# Patient Record
Sex: Female | Born: 1935 | Race: White | Hispanic: No | Smoking: Never smoker
Health system: Southern US, Community
[De-identification: ages and names within clinical notes are randomized; demographics above are authoritative.]

## PROBLEM LIST (undated history)

## (undated) DIAGNOSIS — E785 Hyperlipidemia, unspecified: Secondary | ICD-10-CM

## (undated) DIAGNOSIS — I10 Essential (primary) hypertension: Secondary | ICD-10-CM

## (undated) DIAGNOSIS — K219 Gastro-esophageal reflux disease without esophagitis: Secondary | ICD-10-CM

## (undated) DIAGNOSIS — G20 Parkinson's disease: Secondary | ICD-10-CM

## (undated) DIAGNOSIS — R251 Tremor, unspecified: Secondary | ICD-10-CM

## (undated) HISTORY — PX: APPENDECTOMY: SHX54

## (undated) HISTORY — DX: Essential (primary) hypertension: I10

## (undated) HISTORY — DX: Gastro-esophageal reflux disease without esophagitis: K21.9

## (undated) HISTORY — PX: TONSILLECTOMY: SUR1361

## (undated) HISTORY — DX: Parkinson's disease: G20

## (undated) HISTORY — DX: Hyperlipidemia, unspecified: E78.5

---

## 1998-09-24 ENCOUNTER — Emergency Department (HOSPITAL_COMMUNITY): Admission: EM | Admit: 1998-09-24 | Discharge: 1998-09-24 | Payer: Self-pay | Admitting: Emergency Medicine

## 1998-09-25 ENCOUNTER — Encounter: Payer: Self-pay | Admitting: Emergency Medicine

## 2001-06-17 ENCOUNTER — Encounter: Payer: Self-pay | Admitting: Family Medicine

## 2001-06-17 ENCOUNTER — Encounter: Admission: RE | Admit: 2001-06-17 | Discharge: 2001-06-17 | Payer: Self-pay | Admitting: Family Medicine

## 2001-07-27 ENCOUNTER — Other Ambulatory Visit: Admission: RE | Admit: 2001-07-27 | Discharge: 2001-07-27 | Payer: Self-pay | Admitting: Family Medicine

## 2002-06-18 ENCOUNTER — Encounter: Payer: Self-pay | Admitting: Family Medicine

## 2002-06-18 ENCOUNTER — Encounter: Admission: RE | Admit: 2002-06-18 | Discharge: 2002-06-18 | Payer: Self-pay | Admitting: Family Medicine

## 2003-02-03 ENCOUNTER — Encounter: Admission: RE | Admit: 2003-02-03 | Discharge: 2003-02-03 | Payer: Self-pay | Admitting: Family Medicine

## 2003-02-03 ENCOUNTER — Encounter: Payer: Self-pay | Admitting: Family Medicine

## 2003-03-08 ENCOUNTER — Ambulatory Visit (HOSPITAL_COMMUNITY): Admission: RE | Admit: 2003-03-08 | Discharge: 2003-03-09 | Payer: Self-pay | Admitting: *Deleted

## 2003-03-08 ENCOUNTER — Encounter (INDEPENDENT_AMBULATORY_CARE_PROVIDER_SITE_OTHER): Payer: Self-pay | Admitting: *Deleted

## 2003-07-07 ENCOUNTER — Encounter: Admission: RE | Admit: 2003-07-07 | Discharge: 2003-07-07 | Payer: Self-pay | Admitting: Family Medicine

## 2004-07-06 ENCOUNTER — Encounter: Admission: RE | Admit: 2004-07-06 | Discharge: 2004-07-06 | Payer: Self-pay | Admitting: Family Medicine

## 2005-05-29 HISTORY — PX: CARDIOVASCULAR STRESS TEST: SHX262

## 2005-07-08 ENCOUNTER — Encounter: Admission: RE | Admit: 2005-07-08 | Discharge: 2005-07-08 | Payer: Self-pay | Admitting: Family Medicine

## 2006-07-09 ENCOUNTER — Encounter: Admission: RE | Admit: 2006-07-09 | Discharge: 2006-07-09 | Payer: Self-pay | Admitting: Family Medicine

## 2007-07-13 ENCOUNTER — Encounter: Admission: RE | Admit: 2007-07-13 | Discharge: 2007-07-13 | Payer: Self-pay | Admitting: Family Medicine

## 2008-07-13 ENCOUNTER — Encounter: Admission: RE | Admit: 2008-07-13 | Discharge: 2008-07-13 | Payer: Self-pay | Admitting: Family Medicine

## 2009-07-18 ENCOUNTER — Encounter: Admission: RE | Admit: 2009-07-18 | Discharge: 2009-07-18 | Payer: Self-pay | Admitting: Family Medicine

## 2010-07-19 ENCOUNTER — Encounter
Admission: RE | Admit: 2010-07-19 | Discharge: 2010-07-19 | Payer: Self-pay | Source: Home / Self Care | Attending: Family Medicine | Admitting: Family Medicine

## 2010-07-26 ENCOUNTER — Ambulatory Visit: Payer: Self-pay | Admitting: Cardiovascular Disease

## 2010-11-13 ENCOUNTER — Other Ambulatory Visit: Payer: Self-pay | Admitting: Cardiovascular Disease

## 2010-11-13 DIAGNOSIS — E785 Hyperlipidemia, unspecified: Secondary | ICD-10-CM

## 2010-11-13 NOTE — Telephone Encounter (Signed)
Fax received from pharmacy. Refill completed. Jodette Dyshawn Cangelosi RN  

## 2010-12-07 NOTE — Op Note (Signed)
   NAME:  Shawna Doyle, Shawna Doyle                   ACCOUNT NO.:  [id number]   MEDICAL RECORD NO.:  [id number]                   PATIENT TYPE:  OIB   LOCATION:  NA                                   FACILITY:  [facility]   PHYSICIAN:  Vikki Ports, M.D.         DATE OF BIRTH:  1929/01/23   DATE OF PROCEDURE:  03/08/2003  DATE OF DISCHARGE:                                 OPERATIVE REPORT   PREOPERATIVE DIAGNOSIS:  Symptomatic cholelithiasis.   POSTOPERATIVE DIAGNOSIS:  Symptomatic cholelithiasis.   PROCEDURE:  Laparoscopic cholecystectomy.   SURGEON:  Vikki Ports, M.D.   ASSISTANT:  Sharlet Salina T. Hoxworth, M.D.   ANESTHESIA:  General.   DESCRIPTION OF PROCEDURE:  The patient was taken to the operating room and  placed in the supine position.  After adequate general anesthesia was  induced using endotracheal tube, the abdomen was prepped and draped in the  normal sterile fashion.  Using a transverse infraumbilical incision, I  dissected down to the fascia.  The fascia was opened vertically.  An 0  Vicryl pursestring suture was placed around the fascial defect.  Hasson  trocar was placed in the abdomen and pneumoperitoneum was obtained.  Under  direct visualization a 10 mm port was placed through the subxiphoid region,  two 5 mm ports were placed in the right abdomen.  The gallbladder was  identified and retracted cephalad.  Numerous adhesions to the inferior wall  of the gallbladder were taken down.  The infundibulum was retracted  laterally and the cystic duct was easily identified.  Its junction with the  common duct and gallbladder were verified, and it was triply clipped and  divided.  There was a very tortuous right hepatic artery giving rise to a  cystic duct branch.  This was in a rather abnormal location, but the cystic  artery was completely identified, dissected, triply clipped, and divided.  The gallbladder was on a very thin mesentery, was dissected off the  liver  bed, and removed through the umbilical port.  The infraumbilical fascial  defect was closed with the 0 Vicryl pursestring suture.  Skin incisions were  closed with subcuticular 4-0 Monocryl and Steri-Strips.  Sterile dressings  were applied.  The patient tolerated the procedure well and went to PACU in  good condition.                                               Vikki Ports, M.D.    KRH/MEDQ  D:  03/08/2003  T:  03/08/2003  Job:  [id number]

## 2011-01-29 ENCOUNTER — Other Ambulatory Visit: Payer: Self-pay | Admitting: *Deleted

## 2011-01-29 DIAGNOSIS — E785 Hyperlipidemia, unspecified: Secondary | ICD-10-CM

## 2011-02-05 ENCOUNTER — Encounter: Payer: Self-pay | Admitting: Cardiovascular Disease

## 2011-02-11 ENCOUNTER — Other Ambulatory Visit (INDEPENDENT_AMBULATORY_CARE_PROVIDER_SITE_OTHER): Payer: Medicare Other | Admitting: *Deleted

## 2011-02-11 ENCOUNTER — Ambulatory Visit (INDEPENDENT_AMBULATORY_CARE_PROVIDER_SITE_OTHER): Payer: Medicare Other | Admitting: Cardiovascular Disease

## 2011-02-11 ENCOUNTER — Encounter: Payer: Self-pay | Admitting: Cardiovascular Disease

## 2011-02-11 VITALS — BP 150/80 | HR 84 | Ht 61.0 in | Wt 142.4 lb

## 2011-02-11 DIAGNOSIS — I10 Essential (primary) hypertension: Secondary | ICD-10-CM

## 2011-02-11 DIAGNOSIS — E785 Hyperlipidemia, unspecified: Secondary | ICD-10-CM

## 2011-02-11 MED ORDER — ATORVASTATIN CALCIUM 20 MG PO TABS: 20.0000 mg | ORAL_TABLET | Freq: Every day | ORAL | Status: DC

## 2011-02-11 NOTE — Progress Notes (Signed)
Shawna Doyle Date of Birth  01/05/1929 [facility] / [facility] [address]     [address] [place], [place]  [address] [phone]  Fax  [phone]  History of Present Illness:  Shawna Doyle over it is an elderly female with a history of hypertension and hyperlipidemia. She has a history of chest pains in the past. She had a negative stress test in 2006.  She recently got back from a trip to [place] city. She is able to do all of her normal activities without any significant problems.  She takes her blood pressure on a regular basis. Her readings are typically 130 range. She denies any chest pain or shortness of breath.   Current Outpatient Prescriptions on File Prior to Visit  Medication Sig Dispense Refill  . aspirin 325 MG tablet Take 81 mg by mouth daily.       . Multiple Vitamin (MULTIVITAMIN) tablet Take 1 tablet by mouth daily.           Allergies  Allergen Reactions  . Flagyl (Metronidazole Hcl)   . Sulfur     Past Medical History  Diagnosis Date  . Hypertension   . Hyperlipidemia   . Chest pain   . GERD (gastroesophageal reflux disease)     Past Surgical History  Procedure Date  . Appendectomy   . Tonsillectomy   . Cardiovascular stress test 05/29/2005    EF 80%    History  Smoking status  . Never Smoker   Smokeless tobacco  . Not on file    History  Alcohol Use No    Family History  Problem Relation Age of Onset  . Heart attack Mother   . Heart attack Father   . Heart attack Brother     Reviw of Systems:  Reviewed in the HPI.  All other systems are negative.  Physical Exam: BP 150/80  Pulse 84  Ht 5\' 1"  (1.549 m)  Wt 142 lb 6.4 oz (64.592 kg)  BMI 26.91 kg/m2 The patient is alert and oriented x 3.  The mood and affect are normal.   Skin: warm and dry.  Color is normal.    HEENT:   the sclera are nonicteric.  The mucous membranes are moist.  The carotids are 2+ without bruits.  There is no  thyromegaly.  There is no JVD.    Lungs: clear.  The chest wall is non tender.    Heart: regular rate with a normal S1 and S2.  There are no murmurs, gallops, or rubs. The PMI is not displaced.     Abdomen: good bowel sounds.  There is no guarding or rebound.  There is no hepatosplenomegaly or tenderness.  There are no masses.   Extremities:  no clubbing, cyanosis, or edema.  The legs are without rashes.  The distal pulses are intact.   Neuro:  Cranial nerves II - XII are intact.  Motor and sensory functions are intact.    The gait is normal.  ECG:  Assessment / Plan:

## 2011-02-11 NOTE — Assessment & Plan Note (Signed)
Her blood pressure is a little bit elevated today but her typical blood pressure readings are very well controlled. We will continue with her same medications. I've asked her to exercise on a regular basis and to watch her salt intake.

## 2011-03-08 ENCOUNTER — Other Ambulatory Visit: Payer: Self-pay | Admitting: Family Medicine

## 2011-03-08 DIAGNOSIS — Z1231 Encounter for screening mammogram for malignant neoplasm of breast: Secondary | ICD-10-CM

## 2011-07-22 ENCOUNTER — Ambulatory Visit: Payer: Medicare Other

## 2011-07-24 DIAGNOSIS — M25569 Pain in unspecified knee: Secondary | ICD-10-CM | POA: Diagnosis not present

## 2011-07-25 ENCOUNTER — Ambulatory Visit
Admission: RE | Admit: 2011-07-25 | Discharge: 2011-07-25 | Disposition: A | Discharge status: Home / Self Care | Payer: Medicare Other | Source: Ambulatory Visit | Attending: Family Medicine | Admitting: Family Medicine

## 2011-07-25 DIAGNOSIS — Z1231 Encounter for screening mammogram for malignant neoplasm of breast: Secondary | ICD-10-CM | POA: Diagnosis not present

## 2011-07-31 ENCOUNTER — Encounter: Payer: Self-pay | Admitting: Cardiovascular Disease

## 2011-07-31 ENCOUNTER — Ambulatory Visit (INDEPENDENT_AMBULATORY_CARE_PROVIDER_SITE_OTHER): Payer: Medicare Other | Admitting: Cardiovascular Disease

## 2011-07-31 VITALS — BP 150/78 | HR 89 | Ht 65.0 in | Wt 143.4 lb

## 2011-07-31 DIAGNOSIS — I10 Essential (primary) hypertension: Secondary | ICD-10-CM

## 2011-07-31 NOTE — Assessment & Plan Note (Signed)
Her BP is well controlled.  Continue with her current meds.  I'll see her again in 6 months.

## 2011-07-31 NOTE — Patient Instructions (Signed)
Your physician wants you to follow-up in: 6 months You will receive a reminder letter in the mail two months in advance. If you don't receive a letter, please call our office to schedule the follow-up appointment.  Your physician recommends that you return for a FASTING lipid profile: 6 months   BRING COPY OF FASTING LABS DRAWN AT YOUR PRIMARY DR OFFICE THAT WILL BE DONE IN February/ TO OUR OFFICE PLEASE

## 2011-07-31 NOTE — Progress Notes (Signed)
    Shawna Doyle Date of Birth  1929/07/02 [facility]     [place]  [address]    [address]   [address] [place], [place]  [address]    [place], [place]  [address] [phone]  Fax  [phone]  [phone]  Fax [phone]   History of Present Illness:  Shawna Doyle is an 78 yo with a hx of hypertension and hyperlipidemia.  She has done well since I last saw her.  She denies any chest pain or dyspnea.   Current Outpatient Prescriptions on File Prior to Visit  Medication Sig Dispense Refill  . alendronate (FOSAMAX) 70 MG tablet Take 70 mg by mouth every 7 (seven) days. Take with a full glass of water on an empty stomach.       Marland Kitchen aspirin 325 MG tablet Take 81 mg by mouth daily.       Marland Kitchen atorvastatin (LIPITOR) 20 MG tablet Take 1 tablet (20 mg total) by mouth daily.  30 tablet  11  . Calcium Carb-Cholecalciferol (CALCIUM 600+D3 PO) Take by mouth 2 (two) times daily before a meal.        . Cholecalciferol (VITAMIN D PO) Take 1,000 mg by mouth daily.        Marland Kitchen losartan-hydrochlorothiazide (HYZAAR) 100-12.5 MG per tablet Take 1 tablet by mouth daily.        . Multiple Vitamin (MULTIVITAMIN) tablet Take 1 tablet by mouth daily.          Allergies  Allergen Reactions  . Flagyl (Metronidazole Hcl)   . Sulfur     Past Medical History  Diagnosis Date  . Hypertension   . Hyperlipidemia   . Chest pain   . GERD (gastroesophageal reflux disease)     Past Surgical History  Procedure Date  . Appendectomy   . Tonsillectomy   . Cardiovascular stress test 05/29/2005    EF 80%    History  Smoking status  . Never Smoker   Smokeless tobacco  . Not on file    History  Alcohol Use No    Family History  Problem Relation Age of Onset  . Heart attack Mother   . Heart attack Father   . Heart attack Brother     Reviw of Systems:  Reviewed in the HPI.  All other systems are negative.  Physical Exam: BP 150/78  Pulse 89  Ht 5\' 5"  (1.651 m)  Wt  143 lb 6.4 oz (65.046 kg)  BMI 23.86 kg/m2 The patient is alert and oriented x 3.  The mood and affect are normal.   Skin: warm and dry.  Color is normal.    HEENT:   Neck is supple, no JVD, normal carotids  Lungs: clear   Heart: RR, S1 , S2    Abdomen: + BS, nontender., no HSM  Extremities:  No c/c/e  Neuro:  Nonfocal    ECG: NSR with a 1st degree block, no ST /T abn  Assessment / Plan:

## 2011-08-21 DIAGNOSIS — M25569 Pain in unspecified knee: Secondary | ICD-10-CM | POA: Diagnosis not present

## 2011-08-29 DIAGNOSIS — E7800 Pure hypercholesterolemia, unspecified: Secondary | ICD-10-CM | POA: Diagnosis not present

## 2011-09-13 DIAGNOSIS — M25569 Pain in unspecified knee: Secondary | ICD-10-CM | POA: Diagnosis not present

## 2011-09-25 DIAGNOSIS — H4011X0 Primary open-angle glaucoma, stage unspecified: Secondary | ICD-10-CM | POA: Diagnosis not present

## 2011-09-25 DIAGNOSIS — H409 Unspecified glaucoma: Secondary | ICD-10-CM | POA: Diagnosis not present

## 2011-12-17 DIAGNOSIS — H4011X0 Primary open-angle glaucoma, stage unspecified: Secondary | ICD-10-CM | POA: Diagnosis not present

## 2011-12-17 DIAGNOSIS — H409 Unspecified glaucoma: Secondary | ICD-10-CM | POA: Diagnosis not present

## 2011-12-26 DIAGNOSIS — H6120 Impacted cerumen, unspecified ear: Secondary | ICD-10-CM | POA: Diagnosis not present

## 2011-12-26 DIAGNOSIS — L570 Actinic keratosis: Secondary | ICD-10-CM | POA: Diagnosis not present

## 2012-02-06 ENCOUNTER — Other Ambulatory Visit: Payer: Self-pay | Admitting: Cardiovascular Disease

## 2012-02-06 NOTE — Telephone Encounter (Signed)
Fax Received. Refill Completed. Caragh Gasper Chowoe (R.M.A)   

## 2012-03-05 ENCOUNTER — Ambulatory Visit: Payer: Medicare Other | Admitting: Cardiovascular Disease

## 2012-03-06 DIAGNOSIS — L570 Actinic keratosis: Secondary | ICD-10-CM | POA: Diagnosis not present

## 2012-03-17 DIAGNOSIS — H40019 Open angle with borderline findings, low risk, unspecified eye: Secondary | ICD-10-CM | POA: Diagnosis not present

## 2012-04-23 ENCOUNTER — Encounter: Payer: Self-pay | Admitting: Cardiovascular Disease

## 2012-04-23 ENCOUNTER — Ambulatory Visit (INDEPENDENT_AMBULATORY_CARE_PROVIDER_SITE_OTHER): Payer: Medicare Other | Admitting: Cardiovascular Disease

## 2012-04-23 VITALS — BP 128/68 | HR 75 | Ht 65.0 in | Wt 131.4 lb

## 2012-04-23 DIAGNOSIS — I10 Essential (primary) hypertension: Secondary | ICD-10-CM

## 2012-04-23 DIAGNOSIS — E785 Hyperlipidemia, unspecified: Secondary | ICD-10-CM | POA: Diagnosis not present

## 2012-04-23 LAB — HEPATIC FUNCTION PANEL
AST: 26 U/L (ref 0–37)
Alkaline Phosphatase: 68 U/L (ref 39–117)
Bilirubin, Direct: 0.2 mg/dL (ref 0.0–0.3)
Total Bilirubin: 0.8 mg/dL (ref 0.3–1.2)

## 2012-04-23 LAB — LIPID PANEL: Total CHOL/HDL Ratio: 2

## 2012-04-23 LAB — BASIC METABOLIC PANEL
CO2: 28 mEq/L (ref 19–32)
Calcium: 9.6 mg/dL (ref 8.4–10.5)
Creatinine, Ser: 1 mg/dL (ref 0.4–1.2)
Glucose, Bld: 99 mg/dL (ref 70–99)

## 2012-04-23 NOTE — Progress Notes (Signed)
    Sharol Given Date of Birth  Dec 01, 1928 [facility]     [place]  [address]    [address]   [address] [place], [place]  [address]    [place], [place]  [address] [phone]  Fax  [phone]  [phone]  Fax [phone]  Problems 1. Hypertension 2. Hyperlipidemia   History of Present Illness:  Gabriela is an 78 yo with a hx of hypertension and hyperlipidemia.  She has done well since I last saw her.  She denies any chest pain or dyspnea.   She has been exercising regularly - she goes to the gym.  She has been having difficulty with her right knee and his been getting injections.  She deines any chest pain or dyspnea.  Current Outpatient Prescriptions on File Prior to Visit  Medication Sig Dispense Refill  . alendronate (FOSAMAX) 70 MG tablet Take 70 mg by mouth every 7 (seven) days. Take with a full glass of water on an empty stomach.       Marland Kitchen aspirin 325 MG tablet Take 81 mg by mouth daily.       Marland Kitchen atorvastatin (LIPITOR) 20 MG tablet TAKE ONE TABLET BY MOUTH EVERY DAY  30 tablet  5  . Calcium Carb-Cholecalciferol (CALCIUM 600+D3 PO) Take by mouth 2 (two) times daily before a meal.        . Cholecalciferol (VITAMIN D PO) Take 1,000 mg by mouth daily.        Marland Kitchen losartan-hydrochlorothiazide (HYZAAR) 100-12.5 MG per tablet Take 1 tablet by mouth daily.        . Multiple Vitamin (MULTIVITAMIN) tablet Take 1 tablet by mouth daily.          Allergies  Allergen Reactions  . Flagyl (Metronidazole Hcl)   . Sulfur     Past Medical History  Diagnosis Date  . Hypertension   . Hyperlipidemia   . Chest pain   . GERD (gastroesophageal reflux disease)     Past Surgical History  Procedure Date  . Appendectomy   . Tonsillectomy   . Cardiovascular stress test 05/29/2005    EF 80%    History  Smoking status  . Never Smoker   Smokeless tobacco  . Not on file    History  Alcohol Use No    Family History  Problem Relation Age of Onset    . Heart attack Mother   . Heart attack Father   . Heart attack Brother     Reviw of Systems:  Reviewed in the HPI.  All other systems are negative.  Physical Exam: BP 157/74  Pulse 75  Ht 5\' 5"  (1.651 m)  Wt 131 lb 6.4 oz (59.603 kg)  BMI 21.87 kg/m2 The patient is alert and oriented x 3.  The mood and affect are normal.   Skin: warm and dry.  Color is normal.    HEENT:   Neck is supple, no JVD, normal carotids  Lungs: clear   Heart: RR, S1 , S2    Abdomen: + BS, nontender., no HSM  Extremities:  No c/c/e  Neuro:  Nonfocal    ECG: NSR with a 1st degree block, no ST /T abn  Assessment / Plan:

## 2012-04-23 NOTE — Patient Instructions (Addendum)
Your physician wants you to follow-up in: 6 months You will receive a reminder letter in the mail two months in advance. If you don't receive a letter, please call our office to schedule the follow-up appointment.   Your physician recommends that you return for a FASTING lipid profile: today/ 6 months

## 2012-04-23 NOTE — Assessment & Plan Note (Signed)
We'll check her lipids today. We'll see back in 6 months and check a lipid profile, hepatic profile, and basic metabolic profile. She is to continue the current dose of atorvastatin.

## 2012-04-23 NOTE — Assessment & Plan Note (Signed)
Shawna Doyle is doing very well. She will continue with her same medications. I'll see her again in 6 months.

## 2012-04-29 ENCOUNTER — Encounter: Payer: Self-pay | Admitting: Cardiovascular Disease

## 2012-05-07 DIAGNOSIS — Z23 Encounter for immunization: Secondary | ICD-10-CM | POA: Diagnosis not present

## 2012-05-21 DIAGNOSIS — J069 Acute upper respiratory infection, unspecified: Secondary | ICD-10-CM | POA: Diagnosis not present

## 2012-05-26 ENCOUNTER — Other Ambulatory Visit: Payer: Self-pay | Admitting: Family Medicine

## 2012-05-26 DIAGNOSIS — Z1231 Encounter for screening mammogram for malignant neoplasm of breast: Secondary | ICD-10-CM

## 2012-07-07 DIAGNOSIS — H409 Unspecified glaucoma: Secondary | ICD-10-CM | POA: Diagnosis not present

## 2012-07-07 DIAGNOSIS — H4011X0 Primary open-angle glaucoma, stage unspecified: Secondary | ICD-10-CM | POA: Diagnosis not present

## 2012-07-27 ENCOUNTER — Ambulatory Visit
Admission: RE | Admit: 2012-07-27 | Discharge: 2012-07-27 | Disposition: A | Discharge status: Home / Self Care | Payer: Medicare Other | Source: Ambulatory Visit | Attending: Family Medicine | Admitting: Family Medicine

## 2012-07-27 DIAGNOSIS — Z1231 Encounter for screening mammogram for malignant neoplasm of breast: Secondary | ICD-10-CM

## 2012-08-11 ENCOUNTER — Other Ambulatory Visit: Payer: Self-pay | Admitting: *Deleted

## 2012-08-11 MED ORDER — ATORVASTATIN CALCIUM 20 MG PO TABS: 20.0000 mg | ORAL_TABLET | Freq: Every day | ORAL | Status: DC

## 2012-08-11 NOTE — Telephone Encounter (Signed)
Fax Received. Refill Completed. Brainard Highfill Chowoe (R.M.A)   

## 2012-08-12 ENCOUNTER — Other Ambulatory Visit: Payer: Self-pay | Admitting: *Deleted

## 2012-08-12 NOTE — Telephone Encounter (Signed)
Opened in Error.

## 2012-08-26 ENCOUNTER — Other Ambulatory Visit: Payer: Self-pay | Admitting: Family Medicine

## 2012-08-26 ENCOUNTER — Ambulatory Visit
Admission: RE | Admit: 2012-08-26 | Discharge: 2012-08-26 | Disposition: A | Discharge status: Home / Self Care | Payer: Medicare Other | Source: Ambulatory Visit | Attending: Family Medicine | Admitting: Family Medicine

## 2012-08-26 DIAGNOSIS — R52 Pain, unspecified: Secondary | ICD-10-CM

## 2012-08-26 DIAGNOSIS — M79609 Pain in unspecified limb: Secondary | ICD-10-CM | POA: Diagnosis not present

## 2012-09-05 ENCOUNTER — Other Ambulatory Visit: Payer: Self-pay

## 2012-09-10 ENCOUNTER — Encounter: Payer: Self-pay | Admitting: Cardiovascular Disease

## 2012-09-10 DIAGNOSIS — I2510 Atherosclerotic heart disease of native coronary artery without angina pectoris: Secondary | ICD-10-CM | POA: Diagnosis not present

## 2012-09-10 DIAGNOSIS — Z23 Encounter for immunization: Secondary | ICD-10-CM | POA: Diagnosis not present

## 2012-09-10 DIAGNOSIS — M899 Disorder of bone, unspecified: Secondary | ICD-10-CM | POA: Diagnosis not present

## 2012-09-10 DIAGNOSIS — E7800 Pure hypercholesterolemia, unspecified: Secondary | ICD-10-CM | POA: Diagnosis not present

## 2012-09-10 DIAGNOSIS — M949 Disorder of cartilage, unspecified: Secondary | ICD-10-CM | POA: Diagnosis not present

## 2012-09-10 DIAGNOSIS — I10 Essential (primary) hypertension: Secondary | ICD-10-CM | POA: Diagnosis not present

## 2012-09-10 DIAGNOSIS — M79609 Pain in unspecified limb: Secondary | ICD-10-CM | POA: Diagnosis not present

## 2012-09-10 DIAGNOSIS — E559 Vitamin D deficiency, unspecified: Secondary | ICD-10-CM | POA: Diagnosis not present

## 2012-09-10 DIAGNOSIS — Z0000 Encounter for general adult medical examination without abnormal findings: Secondary | ICD-10-CM | POA: Diagnosis not present

## 2012-09-22 DIAGNOSIS — E876 Hypokalemia: Secondary | ICD-10-CM | POA: Diagnosis not present

## 2012-10-05 DIAGNOSIS — M25569 Pain in unspecified knee: Secondary | ICD-10-CM | POA: Diagnosis not present

## 2012-10-21 DIAGNOSIS — M25569 Pain in unspecified knee: Secondary | ICD-10-CM | POA: Diagnosis not present

## 2012-10-27 DIAGNOSIS — H409 Unspecified glaucoma: Secondary | ICD-10-CM | POA: Diagnosis not present

## 2012-10-27 DIAGNOSIS — H4011X0 Primary open-angle glaucoma, stage unspecified: Secondary | ICD-10-CM | POA: Diagnosis not present

## 2012-12-02 DIAGNOSIS — H6120 Impacted cerumen, unspecified ear: Secondary | ICD-10-CM | POA: Diagnosis not present

## 2013-01-15 ENCOUNTER — Ambulatory Visit (INDEPENDENT_AMBULATORY_CARE_PROVIDER_SITE_OTHER): Payer: Medicare Other | Admitting: Cardiovascular Disease

## 2013-01-15 ENCOUNTER — Encounter: Payer: Self-pay | Admitting: Cardiovascular Disease

## 2013-01-15 VITALS — BP 142/76 | HR 91 | Ht 65.0 in | Wt 123.1 lb

## 2013-01-15 DIAGNOSIS — E785 Hyperlipidemia, unspecified: Secondary | ICD-10-CM

## 2013-01-15 DIAGNOSIS — I10 Essential (primary) hypertension: Secondary | ICD-10-CM

## 2013-01-15 LAB — HEPATIC FUNCTION PANEL
ALT: 16 U/L (ref 0–35)
AST: 24 U/L (ref 0–37)
Albumin: 4 g/dL (ref 3.5–5.2)
Alkaline Phosphatase: 58 U/L (ref 39–117)
Bilirubin, Direct: 0 mg/dL (ref 0.0–0.3)
Total Bilirubin: 0.6 mg/dL (ref 0.3–1.2)
Total Protein: 7.6 g/dL (ref 6.0–8.3)

## 2013-01-15 LAB — BASIC METABOLIC PANEL
BUN: 21 mg/dL (ref 6–23)
CO2: 25 mEq/L (ref 19–32)
Calcium: 9.9 mg/dL (ref 8.4–10.5)
Chloride: 103 mEq/L (ref 96–112)
Creatinine, Ser: 1 mg/dL (ref 0.4–1.2)
GFR: 59.13 mL/min — ABNORMAL LOW (ref >60.00)
Glucose, Bld: 97 mg/dL (ref 70–99)
Potassium: 3.6 mEq/L (ref 3.5–5.1)
Sodium: 138 mEq/L (ref 135–145)

## 2013-01-15 LAB — LIPID PANEL
Cholesterol: 135 mg/dL (ref 0–200)
HDL: 66.5 mg/dL (ref >39.00)
LDL Cholesterol: 54 mg/dL (ref 0–99)
Total CHOL/HDL Ratio: 2
Triglycerides: 71 mg/dL (ref 0.0–149.0)
VLDL: 14.2 mg/dL (ref 0.0–40.0)

## 2013-01-15 NOTE — Patient Instructions (Addendum)
Your physician recommends that you return for lab work in: today  Your physician wants you to follow-up in: 6 months. You will receive a reminder letter in the mail two months in advance. If you don't receive a letter, please call our office to schedule the follow-up appointment.   

## 2013-01-15 NOTE — Assessment & Plan Note (Signed)
We'll check fasting lipids today. We'll check them again when I see her again in 6 months.

## 2013-01-15 NOTE — Progress Notes (Signed)
Shawna Doyle Date of Birth  [birth year] [facility]     [place]  [address]    [address]   [address] [place], [place]  [address]    [place], [place]  [address] [phone]  Fax  [phone]  [phone]  Fax [phone]  Problems 1. Hypertension 2. Hyperlipidemia   History of Present Illness:  Shawna Doyle is an 79 yo with a hx of hypertension and hyperlipidemia.  She has done well since I last saw her.  She denies any chest pain or dyspnea.   She has been exercising regularly - she goes to the gym.  She has been having difficulty with her right knee and his been getting injections.  She deines any chest pain or dyspnea.  January 15, 2013:  Shawna Doyle is doing well.  She is able to do all of her normal activities without problems.  She did have some weakness after doing her laundry.  She felt better after eating and drinking and taking a rest.   No CP , no dyspnea.    Current Outpatient Prescriptions on File Prior to Visit  Medication Sig Dispense Refill  . alendronate (FOSAMAX) 70 MG tablet Take 70 mg by mouth every 7 (seven) days. Take with a full glass of water on an empty stomach.       Marland Kitchen atorvastatin (LIPITOR) 20 MG tablet Take 1 tablet (20 mg total) by mouth daily.  30 tablet  5  . Calcium Carb-Cholecalciferol (CALCIUM 600+D3 PO) Take by mouth 2 (two) times daily before a meal.        . Cholecalciferol (VITAMIN D PO) Take 1,000 mg by mouth daily.        Marland Kitchen losartan-hydrochlorothiazide (HYZAAR) 100-12.5 MG per tablet Take 1 tablet by mouth daily.        . Multiple Vitamin (MULTIVITAMIN) tablet Take 1 tablet by mouth daily.         No current facility-administered medications on file prior to visit.    Allergies  Allergen Reactions  . Flagyl (Metronidazole Hcl)   . Sulfur     Past Medical History  Diagnosis Date  . Hypertension   . Hyperlipidemia   . Chest pain   . GERD (gastroesophageal reflux disease)     Past Surgical History   Procedure Laterality Date  . Appendectomy    . Tonsillectomy    . Cardiovascular stress test  05/29/2005    EF 80%    History  Smoking status  . Never Smoker   Smokeless tobacco  . Not on file    History  Alcohol Use No    Family History  Problem Relation Age of Onset  . Heart attack Mother   . Heart attack Father   . Heart attack Brother     Reviw of Systems:  Reviewed in the HPI.  All other systems are negative.  Physical Exam: BP 142/76  Pulse 91  Ht 5\' 5"  (1.651 m)  Wt 123 lb 1.9 oz (55.847 kg)  BMI 20.49 kg/m2 The patient is alert and oriented x 3.  The mood and affect are normal.   Skin: warm and dry.  Color is normal.    HEENT:   Neck is supple, no JVD, normal carotids  Lungs: clear   Heart: RR, S1 , S2    Abdomen: + BS, nontender., no HSM  Extremities:  No c/c/e  Neuro:  Nonfocal    ECG: January 15, 2013:  NSR at 91,  Inc. RBBB.    Assessment / Plan:

## 2013-01-15 NOTE — Assessment & Plan Note (Signed)
Shawna Doyle is doing very well. She's not having episodes of chest pain or shortness of breath. She's able to do all the chores without any significant problems.  Check labs today. See her again in 6 months for office visit and repeat labs.

## 2013-01-18 DIAGNOSIS — R259 Unspecified abnormal involuntary movements: Secondary | ICD-10-CM | POA: Diagnosis not present

## 2013-01-27 ENCOUNTER — Other Ambulatory Visit: Payer: Self-pay | Admitting: *Deleted

## 2013-01-27 MED ORDER — ATORVASTATIN CALCIUM 20 MG PO TABS: 20.0000 mg | ORAL_TABLET | Freq: Every day | ORAL | Status: DC

## 2013-01-27 NOTE — Telephone Encounter (Signed)
Fax Received. Refill Completed. Shawna Doyle (R.M.A)   

## 2013-02-11 ENCOUNTER — Encounter: Payer: Self-pay | Admitting: Neurology

## 2013-02-11 ENCOUNTER — Ambulatory Visit (INDEPENDENT_AMBULATORY_CARE_PROVIDER_SITE_OTHER): Payer: Medicare Other | Admitting: Neurology

## 2013-02-11 ENCOUNTER — Other Ambulatory Visit: Payer: Self-pay | Admitting: Neurology

## 2013-02-11 VITALS — BP 149/73 | HR 76 | Temp 98.3°F | Ht 62.5 in | Wt 127.0 lb

## 2013-02-11 DIAGNOSIS — Z8632 Personal history of gestational diabetes: Secondary | ICD-10-CM | POA: Diagnosis not present

## 2013-02-11 DIAGNOSIS — G20 Parkinson's disease: Secondary | ICD-10-CM | POA: Diagnosis not present

## 2013-02-11 DIAGNOSIS — G20A1 Parkinson's disease without dyskinesia, without mention of fluctuations: Secondary | ICD-10-CM | POA: Diagnosis not present

## 2013-02-11 DIAGNOSIS — R768 Other specified abnormal immunological findings in serum: Secondary | ICD-10-CM

## 2013-02-11 DIAGNOSIS — Z79899 Other long term (current) drug therapy: Secondary | ICD-10-CM | POA: Diagnosis not present

## 2013-02-11 NOTE — Progress Notes (Addendum)
Subjective:    Patient ID: Shawna Doyle is a 79 y.o. female.  HPI  Huston Foley, MD, PhD [facility] [address], [address] [address] [place], [place] [address]  Dear Dr. Valentina Lucks,   I saw your patient, Shawna Doyle, upon your kind request in my neurologic clinic today for initial consultation of her new onset tremor, and changes in her handwriting, concern for Parkinson's disease. The patient is unaccompanied today. As you know, Shawna Doyle is a very pleasant 79 year old right-handed woman with an underlying medical history of hyperlipidemia, hypertension, colonic polyps, heart disease, diverticulitis, osteopenia and vitamin D deficiency who has noted a new onset right arm tremor for about 2 months. She feels her handwriting is affected by that but has not noticed much in the way of weakness. She denies recurrent headaches changes with her speech or swallowing, changes in her gait or any other focal neurologic problems. She has had some loss of appetite and has lost some weight. There is no family history of Parkinson's disease or tremors and there is no personal history of neuroleptic medication use, no pesticide or toxin exposures. She's currently on multivitamin, calcium, Lipitor, baby aspirin, vitamin D, alendronate, losartan hydrochlorothiazide. I reviewed recent blood work from March which included a CMP, lipid panel, vitamin D, all of which were fairly unremarkable. Her family has noted the tremor. She fell about 2 months ago, while walking the dog, and fell on the road, no Fx, no head injury, no LOC. She has not noted any memory loss, mood d/o, hallucinations, or recurrent HAs. She is mildly bothered by the tremor, in that it is more of a nuisance. She has not fallen recently other than that.    Her Past Medical History Is Significant For: Past Medical History  Diagnosis Date  . Hypertension   . Hyperlipidemia   . Chest pain   . GERD  (gastroesophageal reflux disease)     Her Past Surgical History Is Significant For: Past Surgical History  Procedure Laterality Date  . Appendectomy    . Tonsillectomy    . Cardiovascular stress test  05/29/2005    EF 80%    Her Family History Is Significant For: Family History  Problem Relation Age of Onset  . Heart attack Mother   . Heart attack Father   . Heart attack Brother     Her Social History Is Significant For: History   Social History  . Marital Status: Widowed    Spouse Name: N/A    Number of Children: N/A  . Years of Education: N/A   Social History Main Topics  . Smoking status: Never Smoker   . Smokeless tobacco: None  . Alcohol Use: No  . Drug Use: No  . Sexually Active:    Other Topics Concern  . None   Social History Narrative  . None    Her Allergies Are:  Allergies  Allergen Reactions  . Flagyl (Metronidazole Hcl)   . Sulfur   :   Her Current Medications Are:  Outpatient Encounter Prescriptions as of 02/11/2013  Medication Sig Dispense Refill  . alendronate (FOSAMAX) 70 MG tablet Take 70 mg by mouth every 7 (seven) days. Take with a full glass of water on an empty stomach.       Marland Kitchen aspirin 81 MG tablet Take 81 mg by mouth daily.      Marland Kitchen atorvastatin (LIPITOR) 20 MG tablet Take 1 tablet (20 mg total) by mouth daily.  30 tablet  5  . Calcium Carb-Cholecalciferol (CALCIUM 600+D3 PO) Take by mouth 2 (two) times daily before a meal.        . Cholecalciferol (VITAMIN D PO) Take 1,000 mg by mouth daily.        Marland Kitchen losartan-hydrochlorothiazide (HYZAAR) 100-12.5 MG per tablet Take 1 tablet by mouth daily.        . Multiple Vitamin (MULTIVITAMIN) tablet Take 1 tablet by mouth daily.         No facility-administered encounter medications on file as of 02/11/2013.  :  Review of Systems  HENT: Positive for rhinorrhea.   Neurological: Positive for tremors.    Objective:  Neurologic Exam  Physical Exam Physical Examination:   Filed Vitals:    02/11/13 1337  BP: 149/73  Pulse: 76  Temp: 98.3 F (36.8 C)    General Examination: The patient is a very pleasant 79 y.o. female in no acute distress.  HEENT: Normocephalic, atraumatic, pupils are equal, round and reactive to light and accommodation. Funduscopic exam is normal with sharp disc margins noted. Extraocular tracking shows mild saccadic breakdown without nystagmus noted. There is limitation to upper gaze. There is no decrease in eye blink rate. Hearing is impaired mildly; she has bilateral hearing aids.  Face is symmetric with minimal facial masking and normal facial sensation. There is no lip, neck or jaw tremor. Neck is moderately rigid with intact passive ROM. There are no carotid bruits on auscultation. Oropharynx exam reveals mild mouth dryness. No significant airway crowding is noted. Mallampati is class II. Tongue protrudes centrally and palate elevates symmetrically.   There is no drooling.   Chest: is clear to auscultation without wheezing, rhonchi or crackles noted.  Heart: sounds are regular and normal without murmurs, rubs or gallops noted.   Abdomen: is soft, non-tender and non-distended with normal bowel sounds appreciated on auscultation.  Extremities: There is no pitting edema in the distal lower extremities bilaterally. Pedal pulses are intact. There are no varicose veins.  Skin: is warm and dry with no trophic changes noted. Age-related changes are noted on the skin.   Musculoskeletal: exam reveals no obvious joint deformities other than arthritic changes to her hands, no tenderness, joint swelling or erythema.  Neurologically:  Mental status: The patient is awake and alert, paying good  attention. She is able to completely provide the history. She is oriented to: person, place, time/date, situation, day of week, month of year and year. Her memory, attention, language and knowledge are intact. There is no aphasia, agnosia, apraxia or anomia. There is a no  bradyphrenia. Speech is mildly hypophonic with no dysarthria noted. Mood is congruent and affect is normal.    Cranial nerves are as described above under HEENT exam. In addition, shoulder shrug is normal with equal shoulder height noted.  Motor exam: Normal bulk, and strength for age is noted. There are no dyskinesias noted. Tone is minimally rigid on the RUE with presence of cogwheeling in the right upper extremity. There is overall minimal bradykinesia. There is no drift or rebound.  There is a very mild resting tremor in the right upper extremity and a no other resting tremor. The is intermittent. There is a minimal postural component, no action tremor. Romberg is negative.  Reflexes are 2+ in the upper extremities and 1+ in the lower extremities.   Fine motor skills exam: Finger taps are moderately impaired on the right and mildly impaired on the left. Hand movements are mildly impaired on the right and  not impaired on the left. RAP (rapid alternating patting) is mildly impaired on the right and not impaired on the left. Foot taps are moderately impaired on the right and mildly impaired on the left. Foot agility (in the form of heel stomping) is moderately impaired on the right and mildly impaired on the left.    Cerebellar testing shows no dysmetria or intention tremor on finger to nose testing. Heel to shin is unremarkable bilaterally. There is no truncal or gait ataxia.   Sensory exam is intact to light touch, pinprick, vibration, temperature sense and proprioception in the upper and lower extremities.   Gait, station and balance: She stands up from the seated position with very mild difficulty and does not need to push up with Her hands. She needs no assistance. No veering to one side is noted. She is not noted to lean to the side. Posture is mildly stooped, possibly age-appropriate. Stance is narrow-based. She walks with decrease in stride length and pace and decreased arm swing on the right.  She turns in 3 steps. Tandem walk is not possible. Balance is mildly impaired.   Assessment and Plan:   Assessment and Plan:  In summary, Shawna Doyle is a very pleasant 79 y.o.-year old female with a history and exam consistent with mild parkinsonism, possible R sided Parkinson's disease. I had a long chat with the patient about Her symptoms, my findings and the diagnosis of parkinsonism/Parksinson's disease, its prognosis and treatment options. We talked about medical treatments and non-pharmacological approaches. We talked about maintaining a healthy lifestyle in general. I encouraged the patient to eat healthy, exercise daily and keep well hydrated, to keep a scheduled bedtime and wake time routine, to not skip any meals and eat healthy snacks in between meals and to have protein with every meal. In particular, I stressed the importance of regular exercise, within of course the patient's own mobility limitations. Since Sx are mild and intermittent I would adopt a watchful waiting position, and I am reluctant to put her on Levodopa for fear of SE and since she lives alone. I do want to proceed with a brain MRI and suggested w/u with some additional blood work. I answered all her questions today and the patient was in agreement with the above outlined plan. I would like to see the patient back in 3 months for re-assessment, sooner if the need arises and encouraged her to call with any interim questions, concerns, problems or updates.   Thank you very much for allowing me to participate in the care of this nice patient. If I can be of any further assistance to you please do not hesitate to call me at [phone].  Sincerely,   Huston Foley, MD, PhD

## 2013-02-11 NOTE — Patient Instructions (Addendum)
I think overall you are doing fairly well but I do want to suggest a few things today:  Remember to drink plenty of fluid, eat healthy meals and do not skip any meals. Try to eat protein with a every meal and eat a healthy snack such as fruit or nuts in between meals. Try to keep a regular sleep-wake schedule and try to exercise daily, particularly in the form of walking, 20-30 minutes a day, if you can.   Engage in social activities in your community and with your family and try to keep up with current events by reading the newspaper or watching the news.   As far as your medications are concerned, I would like to suggest    As far as diagnostic testing: MRI brain and blood work.  I would like to see you back in 3 months, sooner if we need to. Please call us with any interim questions, concerns, problems, updates or refill requests.  Please also call us for any test results so we can go over those with you on the phone. Brett Canales is my clinical assistant and will answer any of your questions and relay your messages to me and also relay most of my messages to you.  Our phone number is [phone]. We also have an after hours call service for urgent matters and there is a physician on-call for urgent questions. For any emergencies you know to call 911 or go to the nearest emergency room.

## 2013-02-13 LAB — HGB A1C W/O EAG: Hgb A1c MFr Bld: 6.2 % — ABNORMAL HIGH (ref 4.8–5.6)

## 2013-02-13 LAB — RPR: RPR: NONREACTIVE

## 2013-02-13 LAB — CERULOPLASMIN: Ceruloplasmin: 22.4 mg/dL (ref 16.0–45.0)

## 2013-02-16 NOTE — Progress Notes (Signed)
Quick Note:  Please call patient and advise her that the blood work so far looks fine but the diabetes marker was slightly elevated at 6.2 indicating increased risk for diabetes and rheumatoid factor which is an inflammatory marker and most commonly positive in rheumatoid arthritis was also elevated. Which should be willing to see a rheumatologist? I just want to make sure we're not missing anything. If she is okay with seeing a rheumatologist in consultation I will put in a referral. Please let me know. Huston Foley, MD, PhD [facility] ([facility]  ______

## 2013-02-17 NOTE — Progress Notes (Signed)
Quick Note:  Spoke with patient and relayed results of blood work. Shawna Doyle would like to be referred to a rheumatologist. The patient was also reminded of any future appointments. Patient understood and had no questions.   ______

## 2013-02-17 NOTE — Addendum Note (Signed)
Addended by: Huston Foley on: 02/17/2013 12:28 PM   Modules accepted: Orders

## 2013-02-17 NOTE — Progress Notes (Signed)
Please help initiate referral to rheum, thx

## 2013-02-23 DIAGNOSIS — H409 Unspecified glaucoma: Secondary | ICD-10-CM | POA: Diagnosis not present

## 2013-02-23 DIAGNOSIS — H4011X0 Primary open-angle glaucoma, stage unspecified: Secondary | ICD-10-CM | POA: Diagnosis not present

## 2013-02-24 ENCOUNTER — Other Ambulatory Visit: Payer: Self-pay

## 2013-03-05 DIAGNOSIS — H409 Unspecified glaucoma: Secondary | ICD-10-CM | POA: Diagnosis not present

## 2013-03-05 DIAGNOSIS — H4011X0 Primary open-angle glaucoma, stage unspecified: Secondary | ICD-10-CM | POA: Diagnosis not present

## 2013-03-13 ENCOUNTER — Ambulatory Visit
Admission: RE | Admit: 2013-03-13 | Discharge: 2013-03-13 | Disposition: A | Discharge status: Home / Self Care | Payer: Medicare Other | Source: Ambulatory Visit | Attending: Neurology | Admitting: Neurology

## 2013-03-13 DIAGNOSIS — R269 Unspecified abnormalities of gait and mobility: Secondary | ICD-10-CM

## 2013-03-13 DIAGNOSIS — G20 Parkinson's disease: Secondary | ICD-10-CM

## 2013-03-18 NOTE — Progress Notes (Signed)
Quick Note:  Please call patient regarding the recent brain MRI: The brain scan showed a normal structure of the brain, but some global volume loss which we call atrophy. There were changes in the deeper structures of the brain, which we call white matter changes or microvascular changes. These were reported as mild. These are tiny white spots, that occur with time and are seen in a variety of conditions, including with normal aging, chronic hypertension, chronic headaches, especially migraine HAs, chronic diabetes, chronic hyperlipidemia. These are not strokes and no mass or lesion or contrast enhancement was seen which is reassuring. Again, there were no acute findings, such as a stroke, or mass or blood products. No further action is required on this test at this time, other than re-enforcing the importance of good blood pressure control, good cholesterol control, good blood sugar control, and weight management. Please remind patient to keep any upcoming appointments or tests and to call us with any interim questions, concerns, problems or updates. Thanks,  Huston Foley, MD, PhD    ______

## 2013-03-19 ENCOUNTER — Telehealth: Payer: Self-pay | Admitting: Neurology

## 2013-03-19 DIAGNOSIS — M899 Disorder of bone, unspecified: Secondary | ICD-10-CM | POA: Diagnosis not present

## 2013-03-19 DIAGNOSIS — E559 Vitamin D deficiency, unspecified: Secondary | ICD-10-CM | POA: Diagnosis not present

## 2013-03-19 DIAGNOSIS — M1990 Unspecified osteoarthritis, unspecified site: Secondary | ICD-10-CM | POA: Diagnosis not present

## 2013-03-19 DIAGNOSIS — I10 Essential (primary) hypertension: Secondary | ICD-10-CM | POA: Diagnosis not present

## 2013-03-19 DIAGNOSIS — G20 Parkinson's disease: Secondary | ICD-10-CM | POA: Diagnosis not present

## 2013-03-19 DIAGNOSIS — E7800 Pure hypercholesterolemia, unspecified: Secondary | ICD-10-CM | POA: Diagnosis not present

## 2013-03-23 ENCOUNTER — Telehealth: Payer: Self-pay

## 2013-03-23 DIAGNOSIS — M2550 Pain in unspecified joint: Secondary | ICD-10-CM | POA: Diagnosis not present

## 2013-03-23 DIAGNOSIS — R894 Abnormal immunological findings in specimens from other organs, systems and tissues: Secondary | ICD-10-CM | POA: Diagnosis not present

## 2013-03-23 DIAGNOSIS — M79609 Pain in unspecified limb: Secondary | ICD-10-CM | POA: Diagnosis not present

## 2013-03-23 NOTE — Telephone Encounter (Signed)
Message copied by [facility] on Tue Mar 23, 2013  9:19 AM ------      Message from: Huston Foley      Created: Thu Mar 18, 2013  5:02 PM       Please call patient regarding the recent brain MRI: The brain scan showed a normal structure of the brain, but some global volume loss which we call atrophy. There were changes in the deeper structures of the brain, which we call white matter changes or microvascular changes. These were reported as mild. These are tiny white spots, that occur with time and are seen in a variety of conditions, including with normal aging, chronic hypertension, chronic headaches, especially migraine HAs, chronic diabetes, chronic hyperlipidemia. These are not strokes and no mass or lesion or contrast enhancement was seen which is reassuring. Again, there were no acute findings, such as a stroke, or mass or blood products. No further action is required on this test at this time, other than re-enforcing the importance of good blood pressure control, good cholesterol control, good blood sugar control, and weight management. Please remind patient to keep any upcoming appointments or tests and to call us      with any interim questions, concerns, problems or updates. Thanks,       Huston Foley, MD, PhD                   ------

## 2013-03-23 NOTE — Telephone Encounter (Signed)
I called patient and left a VM that her MRI has come back. That it is normal except for some changes that are associated with aging and some other things that I would prefer she call so I can better explain to her. (Patient has HTN and hyperlipidemia)

## 2013-03-23 NOTE — Telephone Encounter (Signed)
I called patient earlier and she returned my call. I called back and left a VM discussing the findings and how she has at lease two things that would increase findings. Those are HTN and hyperlipidemia. So eat well, amnage blood pressure, blood sugal, weight and keep follow up appointments. Call with any questions.

## 2013-03-24 NOTE — Telephone Encounter (Signed)
I called pt back and relayed the MRI results to her in more detail.   She verbalized understanding.

## 2013-04-14 DIAGNOSIS — M069 Rheumatoid arthritis, unspecified: Secondary | ICD-10-CM | POA: Diagnosis not present

## 2013-04-15 DIAGNOSIS — H9190 Unspecified hearing loss, unspecified ear: Secondary | ICD-10-CM | POA: Diagnosis not present

## 2013-04-15 DIAGNOSIS — H6120 Impacted cerumen, unspecified ear: Secondary | ICD-10-CM | POA: Diagnosis not present

## 2013-05-02 ENCOUNTER — Encounter (HOSPITAL_COMMUNITY): Payer: Self-pay | Admitting: Emergency Medicine

## 2013-05-02 ENCOUNTER — Emergency Department (HOSPITAL_COMMUNITY)
Admission: EM | Admit: 2013-05-02 | Discharge: 2013-05-02 | Disposition: A | Discharge status: Home / Self Care | Payer: Medicare Other | Attending: Emergency Medicine | Admitting: Emergency Medicine

## 2013-05-02 ENCOUNTER — Emergency Department (HOSPITAL_COMMUNITY): Payer: Medicare Other

## 2013-05-02 DIAGNOSIS — I10 Essential (primary) hypertension: Secondary | ICD-10-CM | POA: Insufficient documentation

## 2013-05-02 DIAGNOSIS — R55 Syncope and collapse: Secondary | ICD-10-CM | POA: Insufficient documentation

## 2013-05-02 DIAGNOSIS — Z7982 Long term (current) use of aspirin: Secondary | ICD-10-CM | POA: Diagnosis not present

## 2013-05-02 DIAGNOSIS — Z8719 Personal history of other diseases of the digestive system: Secondary | ICD-10-CM | POA: Diagnosis not present

## 2013-05-02 DIAGNOSIS — R42 Dizziness and giddiness: Secondary | ICD-10-CM | POA: Diagnosis not present

## 2013-05-02 DIAGNOSIS — Z79899 Other long term (current) drug therapy: Secondary | ICD-10-CM | POA: Diagnosis not present

## 2013-05-02 DIAGNOSIS — E785 Hyperlipidemia, unspecified: Secondary | ICD-10-CM | POA: Insufficient documentation

## 2013-05-02 DIAGNOSIS — R404 Transient alteration of awareness: Secondary | ICD-10-CM | POA: Diagnosis not present

## 2013-05-02 HISTORY — DX: Tremor, unspecified: R25.1

## 2013-05-02 LAB — CBC WITH DIFFERENTIAL/PLATELET
Basophils Absolute: 0 10*3/uL (ref 0.0–0.1)
Eosinophils Absolute: 0.1 10*3/uL (ref 0.0–0.7)
Eosinophils Relative: 1 % (ref 0–5)
HCT: 36.6 % (ref 36.0–46.0)
Hemoglobin: 12.9 g/dL (ref 12.0–15.0)
Lymphocytes Relative: 26 % (ref 12–46)
Lymphs Abs: 2.3 10*3/uL (ref 0.7–4.0)
MCH: 31.5 pg (ref 26.0–34.0)
MCHC: 35.2 g/dL (ref 30.0–36.0)
MCV: 89.5 fL (ref 78.0–100.0)
Monocytes Absolute: 0.6 10*3/uL (ref 0.1–1.0)
Monocytes Relative: 7 % (ref 3–12)
Platelets: 321 10*3/uL (ref 150–400)
RDW: 12.5 % (ref 11.5–15.5)

## 2013-05-02 LAB — URINALYSIS, ROUTINE W REFLEX MICROSCOPIC
Glucose, UA: NEGATIVE mg/dL
Hgb urine dipstick: NEGATIVE
Ketones, ur: NEGATIVE mg/dL
Leukocytes, UA: NEGATIVE
Protein, ur: NEGATIVE mg/dL
Urobilinogen, UA: 0.2 mg/dL (ref 0.0–1.0)

## 2013-05-02 LAB — BASIC METABOLIC PANEL
BUN: 21 mg/dL (ref 6–23)
CO2: 28 mEq/L (ref 19–32)
Calcium: 9.9 mg/dL (ref 8.4–10.5)
Creatinine, Ser: 1.01 mg/dL (ref 0.50–1.10)
GFR calc non Af Amer: 50 mL/min — ABNORMAL LOW (ref >90)
Sodium: 139 mEq/L (ref 135–145)

## 2013-05-02 NOTE — ED Provider Notes (Signed)
CSN: [id number]     Arrival date & time 05/02/13  1208 History   First MD Initiated Contact with Patient 05/02/13 1211     Chief Complaint  Patient presents with  . Near Syncope   (Consider location/radiation/quality/duration/timing/severity/associated sxs/prior Treatment) HPI Pt reports she was in church this morning when she began to feel lightheaded and dizzy. She felt like she needed to have a bowel movement so she walked out and at put her head down on a podium. She had a brief 5-10 second loss of consciousness, states bystanders helped to the ground. She came to quickly, no seizure activity and went to have a BM. Denies diarrhea or bleeding, states she feels fine now. Denies any CP, SOB, or palpitations prior to this episode. Admits she does not drink as much water as she should.   Past Medical History  Diagnosis Date  . Hypertension   . Hyperlipidemia   . Chest pain   . GERD (gastroesophageal reflux disease)   . Tremor     Tremor in right arm, Pt not sure why   Past Surgical History  Procedure Laterality Date  . Appendectomy    . Tonsillectomy    . Cardiovascular stress test  05/29/2005    EF 80%   Family History  Problem Relation Age of Onset  . Heart attack Mother   . Heart attack Father   . Heart attack Brother    History  Substance Use Topics  . Smoking status: Never Smoker   . Smokeless tobacco: Not on file  . Alcohol Use: No   OB History   Grav Para Term Preterm Abortions TAB SAB Ect Mult Living                 Review of Systems All other systems reviewed and are negative except as noted in HPI.   Allergies  Flagyl and Sulfur  Home Medications   Current Outpatient Rx  Name  Route  Sig  Dispense  Refill  . alendronate (FOSAMAX) 70 MG tablet   Oral   Take 70 mg by mouth every 7 (seven) days. Take with a full glass of water on an empty stomach.          Marland Kitchen aspirin 81 MG tablet   Oral   Take 81 mg by mouth daily.         Marland Kitchen atorvastatin  (LIPITOR) 20 MG tablet   Oral   Take 1 tablet (20 mg total) by mouth daily.   30 tablet   5   . Calcium Carb-Cholecalciferol (CALCIUM 600+D3 PO)   Oral   Take by mouth 2 (two) times daily before a meal.           . Cholecalciferol (VITAMIN D PO)   Oral   Take 1,000 mg by mouth daily.           . folic acid (FOLVITE) 1 MG tablet   Oral   Take 1 mg by mouth daily.         Marland Kitchen losartan-hydrochlorothiazide (HYZAAR) 100-12.5 MG per tablet   Oral   Take 1 tablet by mouth daily.           . methotrexate (RHEUMATREX) 2.5 MG tablet   Oral   Take 7.5 mg by mouth once a week. Every Thursday Caution:Chemotherapy. Protect from light.         . Multiple Vitamin (MULTIVITAMIN) tablet   Oral   Take 1 tablet by mouth daily.  BP 137/56  Pulse 74  Temp(Src) 97.7 F (36.5 C) (Oral)  Resp 27  Ht 5\' 4"  (1.626 m)  Wt 124 lb (56.246 kg)  BMI 21.27 kg/m2  SpO2 95% Physical Exam  Nursing note and vitals reviewed. Constitutional: She is oriented to person, place, and time. She appears well-developed and well-nourished.  HENT:  Head: Normocephalic and atraumatic.  Eyes: EOM are normal. Pupils are equal, round, and reactive to light.  Neck: Normal range of motion. Neck supple.  Cardiovascular: Normal rate, normal heart sounds and intact distal pulses.   Pulmonary/Chest: Effort normal and breath sounds normal.  Abdominal: Bowel sounds are normal. She exhibits no distension. There is no tenderness.  Musculoskeletal: Normal range of motion. She exhibits no edema and no tenderness.  Neurological: She is alert and oriented to person, place, and time. She has normal strength. No cranial nerve deficit or sensory deficit.  Skin: Skin is warm and dry. No rash noted.  Psychiatric: She has a normal mood and affect.    ED Course  Procedures (including critical care time) Labs Review Labs Reviewed  BASIC METABOLIC PANEL - Abnormal; Notable for the following:    Glucose, Bld 112  (*)    GFR calc non Af Amer 50 (*)    GFR calc Af Amer 58 (*)    All other components within normal limits  URINALYSIS, ROUTINE W REFLEX MICROSCOPIC - Abnormal; Notable for the following:    APPearance CLOUDY (*)    All other components within normal limits  CBC WITH DIFFERENTIAL  TROPONIN I   Imaging Review Ct Head Wo Contrast  05/02/2013   CLINICAL DATA:  Hypertensive hyperlipidemic patient with episode of loss of consciousness for 5 seconds.  EXAM: CT HEAD WITHOUT CONTRAST  TECHNIQUE: Contiguous axial images were obtained from the base of the skull through the vertex without intravenous contrast.  COMPARISON:  03/11/2013 MR.  FINDINGS: No intracranial hemorrhage.  Small vessel disease type changes without CT evidence of large acute infarct.  Mild atrophy without hydrocephalus.  No intracranial mass lesion noted on this unenhanced exam.  Vascular calcifications.  IMPRESSION: No intracranial hemorrhage.  Small vessel disease type changes without CT evidence of large acute infarct.  Mild atrophy without hydrocephalus.   Electronically Signed   By: Bridgett Larsson M.D.   On: 05/02/2013 13:54    EKG Interpretation     Ventricular Rate:  66 PR Interval:  209 QRS Duration: 99 QT Interval:  384 QTC Calculation: 403 R Axis:   52 Text Interpretation:  Sinus rhythm Borderline prolonged PR interval RSR' in V1 or V2, right VCD or RVH ST elev, probable normal early repol pattern No old tracing to compare            MDM   1. Vasovagal syncope     Labs and imaging reviewed. Pt not orthostatic and walks to bathroom without symptoms. She likely had a vasovagal syncope episode. Discussed admission for further evaluation including possibly dysrhythmia however the patient would like to go home.     Calen Posch B. Bernette Mayers, MD 05/02/13 [phone]

## 2013-05-02 NOTE — ED Notes (Signed)
Pt at church, started feeling hot, took off sweater and went out to hall.  Pt put head down on podium, witnesses said she lost consciousness for about 5 seconds.  Pt felt need to have BM and went bathroom and had a BM and felt fine after that.  No blood noted in BM.

## 2013-05-03 DIAGNOSIS — Z23 Encounter for immunization: Secondary | ICD-10-CM | POA: Diagnosis not present

## 2013-05-03 DIAGNOSIS — R55 Syncope and collapse: Secondary | ICD-10-CM | POA: Diagnosis not present

## 2013-05-12 ENCOUNTER — Other Ambulatory Visit: Payer: Self-pay

## 2013-05-12 DIAGNOSIS — Z1231 Encounter for screening mammogram for malignant neoplasm of breast: Secondary | ICD-10-CM

## 2013-05-27 ENCOUNTER — Other Ambulatory Visit: Payer: Self-pay

## 2013-06-01 ENCOUNTER — Encounter: Payer: Self-pay | Admitting: Neurology

## 2013-06-01 ENCOUNTER — Ambulatory Visit (INDEPENDENT_AMBULATORY_CARE_PROVIDER_SITE_OTHER): Payer: Medicare Other | Admitting: Neurology

## 2013-06-01 VITALS — BP 140/81 | HR 77 | Temp 97.0°F | Ht 62.5 in | Wt 125.0 lb

## 2013-06-01 DIAGNOSIS — G20 Parkinson's disease: Secondary | ICD-10-CM | POA: Diagnosis not present

## 2013-06-01 HISTORY — DX: Parkinson's disease: G20

## 2013-06-01 NOTE — Patient Instructions (Signed)
I think your parkinsonism is mild and stable, which is reassuring. Nevertheless, as you know, this disease does progress with time. It can affect your balance, your memory, your mood, your bowel and bladder function, your posture, balance and walking. Overall you are doing fairly well but I do want to suggest a few things today:    Remember to drink plenty of fluid, eat healthy meals and do not skip any meals. Try to eat protein with a every meal and eat a healthy snack such as fruit or nuts in between meals. Try to keep a regular sleep-wake schedule and try to exercise daily, particularly in the form of walking, 20-30 minutes a day, if you can.   Taking your medication on schedule is key.   Try to stay active physically and mentally. Engage in social activities in your community and with your family and try to keep up with current events by reading the newspaper or watching the news. Try to do word puzzles and you may like to do word puzzles and brain games on the computer such as on [email or web address].[email or web address]   As far as your medications are concerned, I would like to suggest no new medication. Given your recent fainting spell I feel reluctant to put you parkinson's medication, especially as you live alone. Please change positions slowly.   As far as diagnostic testing, I will order: no new test today.   I would like to see you back in 6 months, sooner if we need to. Please call us with any interim questions, concerns, problems, or updates.  Brett Canales is my clinical assistant and will answer any of your questions and relay your messages to me and also relay most of my messages to you.  Our phone number is [phone]. We also have an after hours call service for urgent matters and there is a physician on-call for urgent questions, that cannot wait till the next work day. For any emergencies you know to call 911 or go to the nearest emergency room.

## 2013-06-01 NOTE — Progress Notes (Signed)
Subjective:    Patient ID: Shawna Doyle is a 79 y.o. female.  HPI  Interim history:   Shawna Doyle is a very pleasant 79 year old right-handed woman with an underlying medical history of hyperlipidemia, hypertension, colonic polyps, heart disease, diverticulitis, osteopenia and vitamin D deficiency who presents for followup consultation of Shawna Doyle mild parkinsonism. She is unaccompanied today. I first met Shawna Doyle on 02/11/2013, and which time I suggested watchful waiting as far as Shawna Doyle parkinsonian symptoms are concerned and no new medications. I did suggest a brain MRI. She had a brain MRI without contrast on 03/13/2013 and I reviewed the results: Abnormal MRI scan of the brain showing mild changes of chronic microvascular ischemia and generalized cerebral atrophy.  We also called over the test results. In the interim, on 05/02/2013 she presented to the emergency room after a brief episode of syncope while at church. She felt lightheaded and had a brief passing out spell but someone caught Shawna Doyle and was told she was out for about 5 seconds. She did not injure herself. She indicates having had a good breakfast, but has not been drinking enough fluid, but has become better with that. She did have a plain head CT on 05/02/2013 and I reviewed the results: No intracranial hemorrhage. Small vessel disease type changes without CT evidence of large acute infarct. Mild atrophy without hydrocephalus. It was felt that she most likely had a vasovagal syncope. It was suggested that she stay overnight for further observation and workup she declined. She was not found to be orthostatic. She had noted a right arm tremor and change in Shawna Doyle handwriting. She denied recurrent headaches changes with Shawna Doyle speech or swallowing, changes in Shawna Doyle gait or any other focal neurologic problems. She has had some loss of appetite and has lost some weight. There is no family history of Parkinson's disease or tremors and there is no  personal history of neuroleptic medication use, no pesticide or toxin exposures. She's currently on multivitamin, calcium, Lipitor, baby aspirin, vitamin D, alendronate, losartan hydrochlorothiazide. I reviewed recent blood work from March which included a CMP, lipid panel, vitamin D, all of which were fairly unremarkable.  Shawna Doyle family has noted the tremor. She fell a little over 6 months ago, while walking the dog, and fell on the road, no Fx, no head injury, no LOC. She has not noted any memory loss, mood d/o, hallucinations, or recurrent HAs. She is mildly bothered by the tremor, in that it is more of a nuisance. She has not fallen recently other than that.  She has been driving, not at night and tries to avoid the interstate. She lives in an apartment and does Shawna Doyle own groceries, cleaning, cooking. She lives alone and keeps Shawna Doyle GD's dog.    Shawna Doyle Past Medical History Is Significant For: Past Medical History  Diagnosis Date  . Hypertension   . Hyperlipidemia   . Chest pain   . GERD (gastroesophageal reflux disease)   . Tremor     Tremor in right arm, Pt not sure why    Shawna Doyle Past Surgical History Is Significant For: Past Surgical History  Procedure Laterality Date  . Appendectomy    . Tonsillectomy    . Cardiovascular stress test  05/29/2005    EF 80%    Shawna Doyle Family History Is Significant For: Family History  Problem Relation Age of Onset  . Heart attack Mother   . Heart attack Father   . Heart attack Brother     Shawna Doyle Social  History Is Significant For: History   Social History  . Marital Status: Widowed    Spouse Name: N/A    Number of Children: N/A  . Years of Education: N/A   Social History Main Topics  . Smoking status: Never Smoker   . Smokeless tobacco: None  . Alcohol Use: No  . Drug Use: No  . Sexual Activity: None   Other Topics Concern  . None   Social History Narrative  . None    Shawna Doyle Allergies Are:  Allergies  Allergen Reactions  . Flagyl [Metronidazole  Hcl]   . Sulfur   :   Shawna Doyle Current Medications Are:  Outpatient Encounter Prescriptions as of 06/01/2013  Medication Sig  . alendronate (FOSAMAX) 70 MG tablet Take 70 mg by mouth every 7 (seven) days. Take with a full glass of water on an empty stomach.   Marland Kitchen aspirin 81 MG tablet Take 81 mg by mouth daily.  Marland Kitchen atorvastatin (LIPITOR) 20 MG tablet Take 1 tablet (20 mg total) by mouth daily.  . Calcium Carb-Cholecalciferol (CALCIUM 600+D3 PO) Take by mouth 2 (two) times daily before a meal.    . Cholecalciferol (VITAMIN D PO) Take 1,000 mg by mouth daily.    . folic acid (FOLVITE) 1 MG tablet Take 1 mg by mouth daily.  Marland Kitchen losartan-hydrochlorothiazide (HYZAAR) 100-12.5 MG per tablet Take 1 tablet by mouth daily.    . methotrexate (RHEUMATREX) 2.5 MG tablet Take 7.5 mg by mouth once a week. Every Thursday Caution:Chemotherapy. Protect from light.  . Multiple Vitamin (MULTIVITAMIN) tablet Take 1 tablet by mouth daily.    . TRAVATAN Z 0.004 % SOLN ophthalmic solution Place 1 drop into both eyes at bedtime.  :  Review of Systems:  Out of a complete 14 point review of systems, all are reviewed and negative with the exception of these symptoms as listed below:  Review of Systems  Constitutional: Negative.   HENT: Negative.   Eyes: Negative.   Respiratory: Negative.   Cardiovascular: Negative.   Gastrointestinal: Negative.   Endocrine: Negative.   Genitourinary: Negative.   Musculoskeletal: Negative.   Skin: Negative.   Allergic/Immunologic: Negative.   Neurological: Positive for tremors.  Hematological: Negative.   Psychiatric/Behavioral: Negative.     Objective:  Neurologic Exam  Physical Exam Physical Examination:   Filed Vitals:   06/01/13 1519  BP: 140/81  Pulse: 77  Temp: 97 F (36.1 C)     General Examination: The patient is a very pleasant 79 y.o. female in no acute distress. She is able to provide history very well. She is in good spirits. She is well-groomed. She is  situated comfortably in the chair.  HEENT: Normocephalic, atraumatic, pupils are equal, round and reactive to light and accommodation. Funduscopic exam is normal with sharp disc margins noted. Extraocular tracking shows mild saccadic breakdown without nystagmus noted. There is limitation to upper gaze. There is no decrease in eye blink rate. Hearing is impaired mildly; she has bilateral hearing aids.  Face is symmetric with minimal facial masking and normal facial sensation. There is no lip, neck or jaw tremor. Neck is moderately rigid with intact passive ROM. There are no carotid bruits on auscultation. Oropharynx exam reveals mild mouth dryness. No significant airway crowding is noted. Mallampati is class II. Tongue protrudes centrally and palate elevates symmetrically.   There is no drooling.   Chest: is clear to auscultation without wheezing, rhonchi or crackles noted.  Heart: sounds are regular and normal without murmurs,  rubs or gallops noted.   Abdomen: is soft, non-tender and non-distended with normal bowel sounds appreciated on auscultation.  Extremities: There is no pitting edema in the distal lower extremities bilaterally. Pedal pulses are intact. There are no varicose veins.  Skin: is warm and dry with no trophic changes noted. Age-related changes are noted on the skin.   Musculoskeletal: exam reveals no obvious joint deformities other than arthritic changes to Shawna Doyle hands, no tenderness, joint swelling or erythema.  Neurologically:  Mental status: The patient is awake and alert, paying good  attention. She is able to completely provide the history. She is oriented to: person, place, time/date, situation, day of week, month of year and year. Shawna Doyle memory, attention, language and knowledge are intact. There is no aphasia, agnosia, apraxia or anomia. There is a no bradyphrenia. Speech is mildly hypophonic with no dysarthria noted. Mood is congruent and affect is normal.    Cranial nerves  are as described above under HEENT exam. In addition, shoulder shrug is normal with equal shoulder height noted.  Motor exam: Normal bulk, and strength for age is noted. There are no dyskinesias noted. Tone is minimally rigid on the RUE with presence of cogwheeling in the right upper extremity. There is overall minimal bradykinesia. There is no drift or rebound.  There is a very mild resting tremor in the right upper extremity and a no other resting tremor. The is intermittent. There is a minimal postural component, no action tremor. Romberg is negative.  Reflexes are 2+ in the upper extremities and 1+ in the lower extremities.   Fine motor skills exam: Finger taps are moderately impaired on the right and mildly impaired on the left. Hand movements are mildly impaired on the right and not impaired on the left. RAP (rapid alternating patting) is mildly impaired on the right and not impaired on the left. Foot taps are moderately impaired on the right and mildly impaired on the left. Foot agility (in the form of heel stomping) is moderately impaired on the right and mildly impaired on the left.    Cerebellar testing shows no dysmetria or intention tremor on finger to nose testing. Heel to shin is unremarkable bilaterally. There is no truncal or gait ataxia.   Sensory exam is intact to light touch, pinprick, vibration, temperature sense and proprioception in the upper and lower extremities.   Gait, station and balance: She stands up from the seated position with very mild difficulty and does not need to push up with Shawna Doyle hands. She needs no assistance. No veering to one side is noted. She is not noted to lean to the side. Posture is mildly stooped, possibly age-appropriate. Stance is narrow-based. She walks with decrease in stride length and pace and decreased arm swing on the right. She turns in 3 steps. Tandem walk is not possible. Balance is mildly impaired. She does not report any lightheadedness upon  standing.  Assessment and Plan:   In summary, BRINNLEY LACAP is a very pleasant 79 year old female with a history and exam consistent with mild parkinsonism, possible R sided Parkinson's disease. She appears to be stable in the last 4 months. I do believe that Shawna Doyle fainting spell was independent of Shawna Doyle parkinsonism. It sounds like a true vasovagal syncope. She does have a tendency to not drink enough fluid and she often skips Shawna Doyle lunch. I again had a long chat with the patient about Shawna Doyle symptoms, my findings and the diagnosis of parkinsonism/Parksinson's disease, its prognosis and  treatment options. We talked about medical treatments and non-pharmacological approaches. We talked about maintaining a healthy lifestyle in general. I encouraged the patient to eat healthy, exercise daily and keep well hydrated, to keep a scheduled bedtime and wake time routine, to not skip any meals and eat healthy snacks in between meals and to have protein with every meal. In particular, I stressed the importance of regular exercise, within of course the patient's own mobility limitations. Since Shawna Doyle symptoms are mild and the tremor is intermittent and because of the fact that she lives alone and had a recent syncopal spell I would be reluctant to start Shawna Doyle on medication for Parkinson's disease for fear of side effects such as lightheadedness, dizziness, and hallucinations, nausea. We talked about Shawna Doyle recent brain MRI findings. I would like to see the patient back in 6 months for re-assessment, sooner if the need arises and encouraged Shawna Doyle to call with any interim questions, concerns, problems or updates. She was in agreement. As far short driving is concerned, I encouraged Shawna Doyle to only drive locally and 35 miles zones, during daylight, and avoid interstates.

## 2013-06-07 IMAGING — US US EXTREM LOW VENOUS*R*
1 series · 13 of 24 positions shown · non-contrast
Comparison: None.

CLINICAL DATA: Right leg pain, evaluate for DVT

RIGHT LOWER EXTREMITY VENOUS DUPLEX ULTRASOUND
TECHNIQUE: Gray-scale sonography with graded compression, as well
as color Doppler and duplex ultrasound, were performed to evaluate
the deep venous system of the lower extremity from the level of the
common femoral vein through the popliteal and proximal calf veins.
Spectral Doppler was utilized to evaluate flow at rest and with
distal augmentation maneuvers.

[Series 1: us extrem low venous*right* · 13 of 39 slices shown]
[im 1/39]
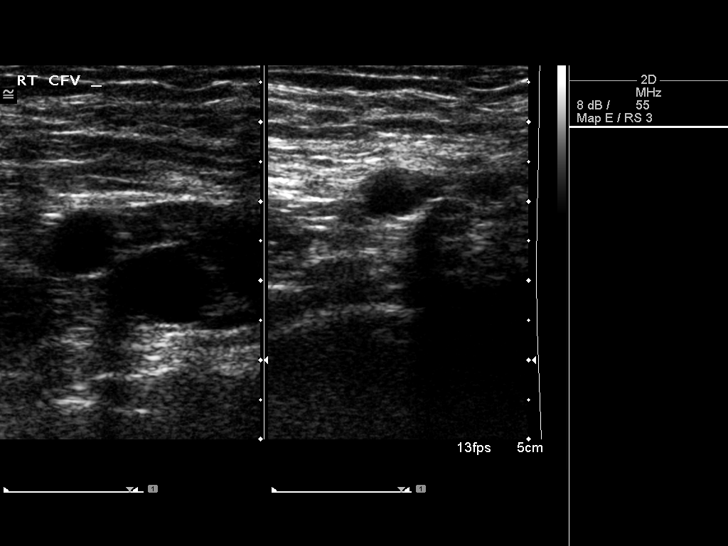
[im 4/39]
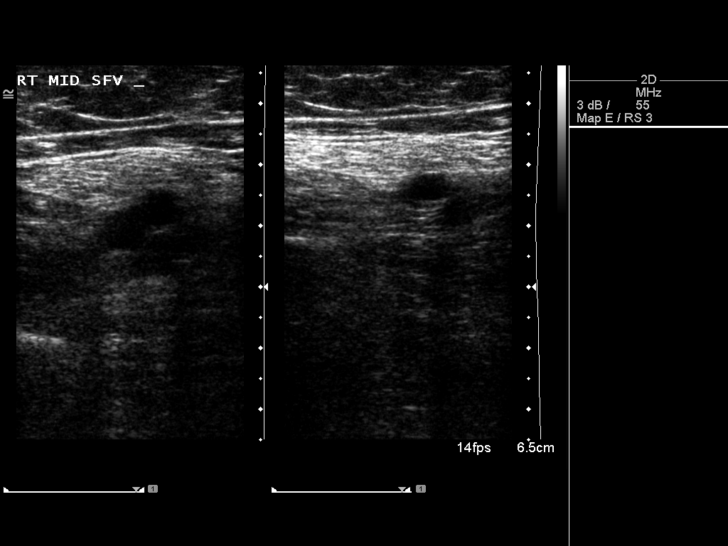
[im 7/39]
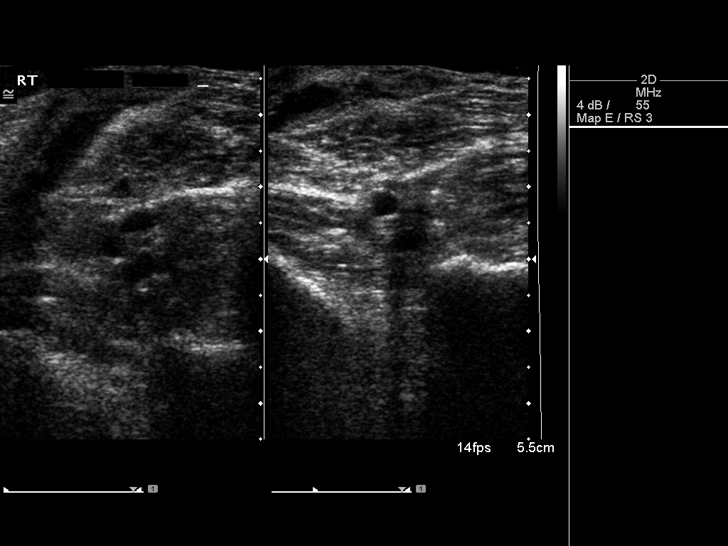
[im 10/39]
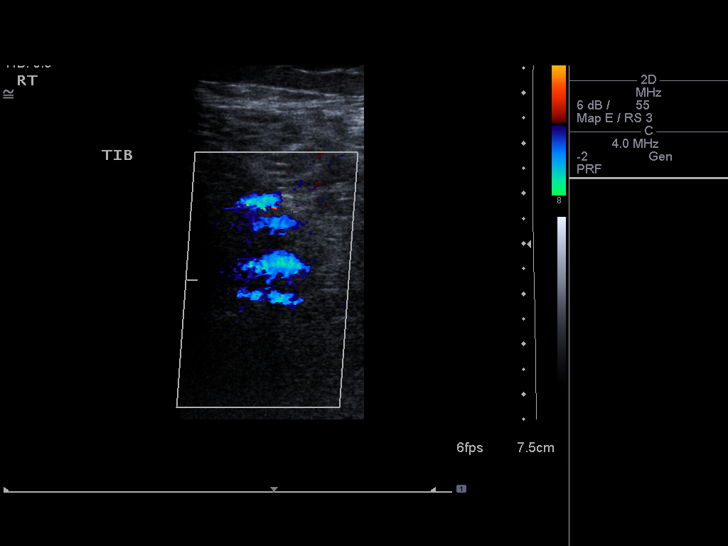
[im 14/39]
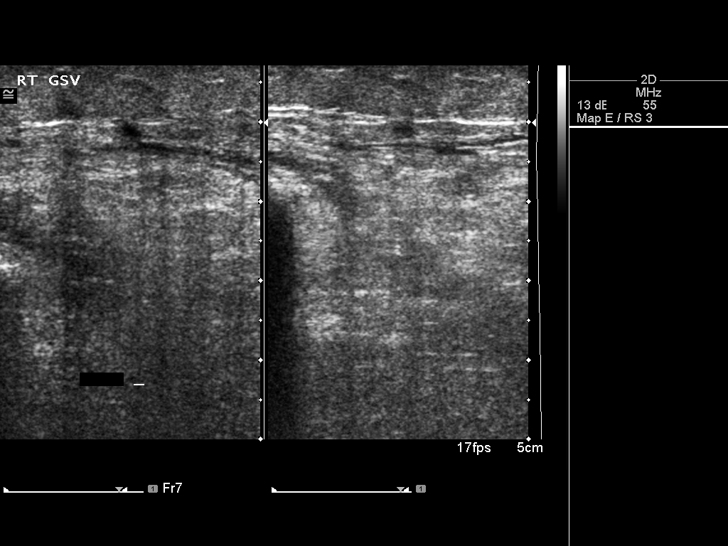
[im 17/39]
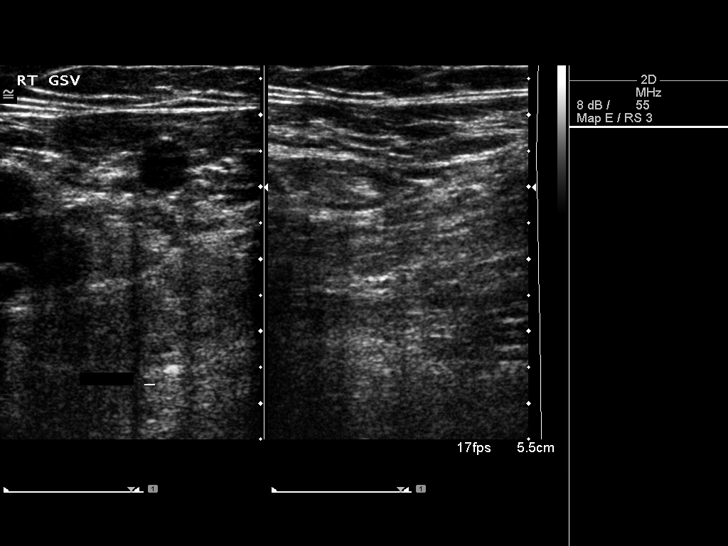
[im 20/39]
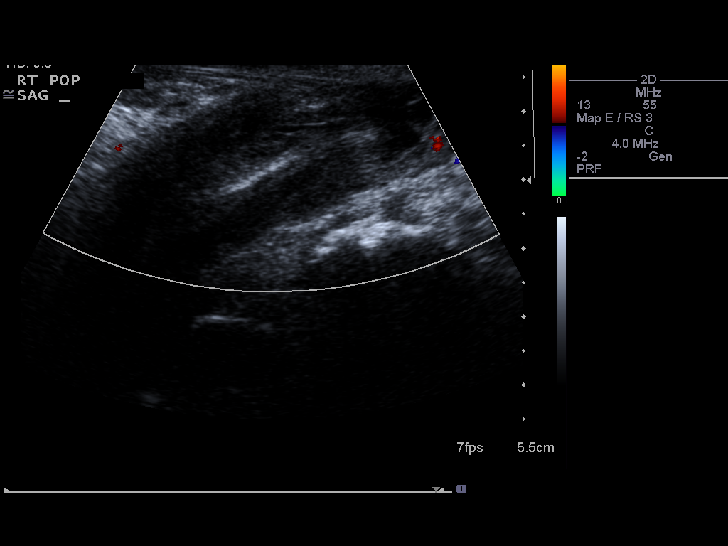
[im 22/39]
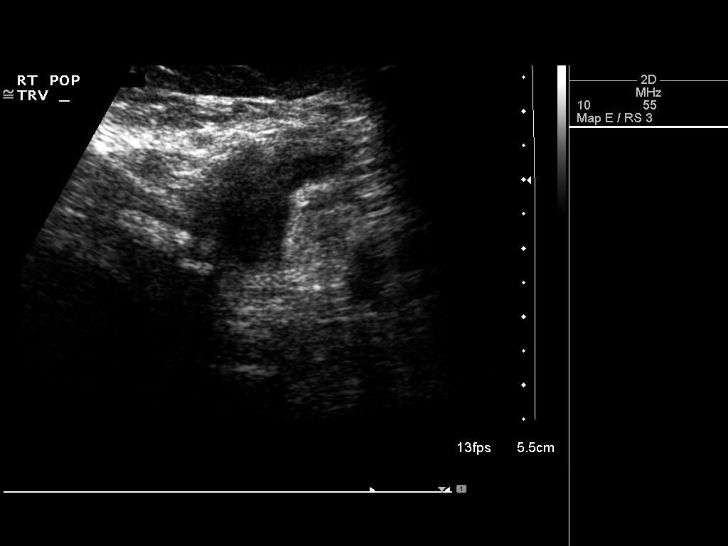
[im 25/39]
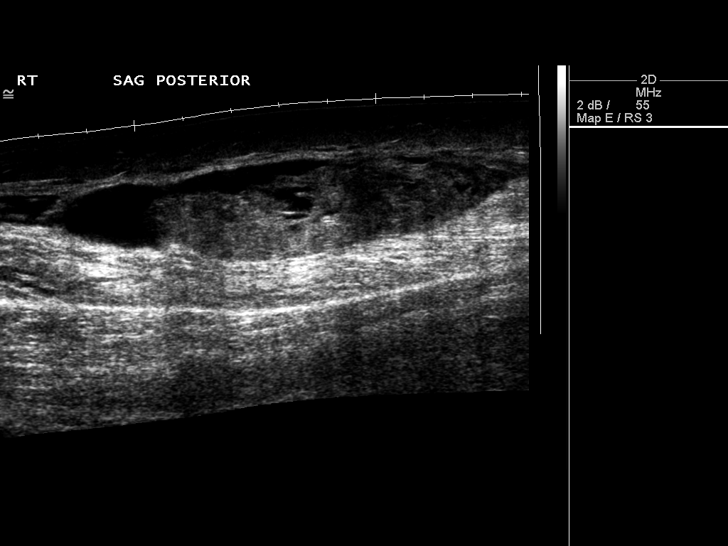
[im 29/39]
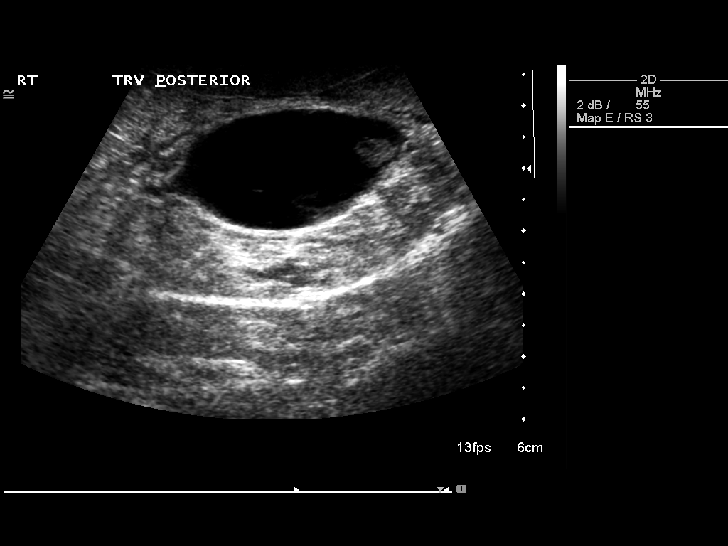
[im 32/39]
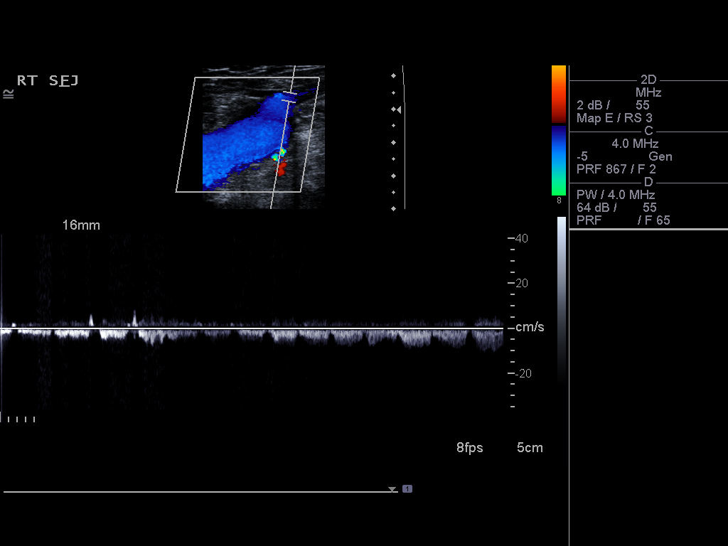
[im 35/39]
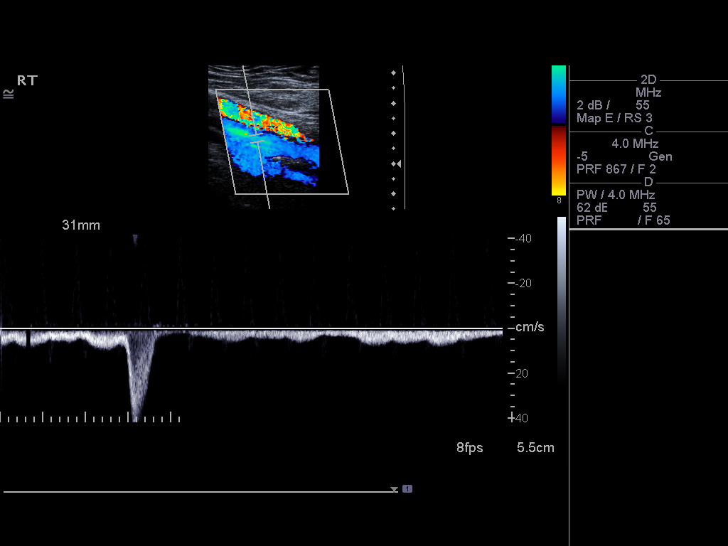
[im 39/39]
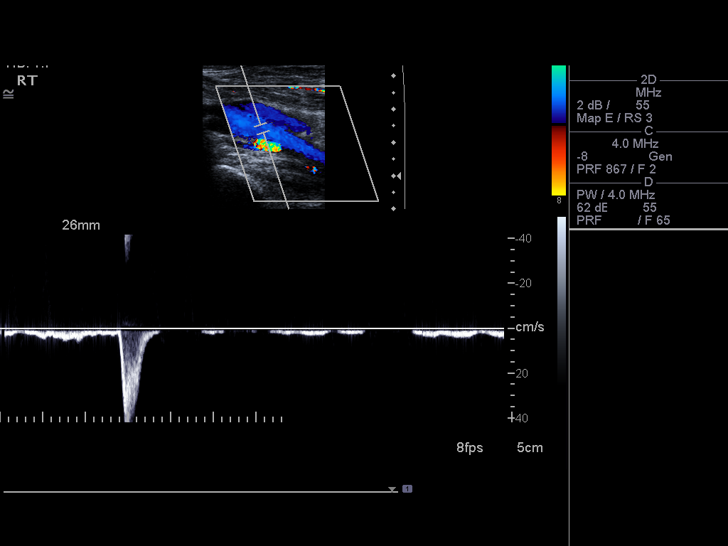

[13 of 24 positions shown; findings below may reference images not displayed]

FINDINGS: The visualized right lower extremity deep venous system
appears patent.

Normal compressibility.  Patent color Doppler flow.  Satisfactory
spectral Doppler with respiratory variation and response to
augmentation.

The greater saphenous vein, where visualized, is patent and
compressible.

4.5 x 1.5 x 1.9 cm mildly complex popliteal fossa cyst.  Associated
9.3 x 2.1 x 4.4 cm complex subcutaneous fluid collection in the
posterior calf, raising the possibility of cyst rupture.
IMPRESSION: No deep venous thrombosis in the visualized right lower extremity.

Suspected ruptured popliteal fossa cyst with associated
subcutaneous fluid collection in the posterior calf, as described
above.

## 2013-07-06 ENCOUNTER — Other Ambulatory Visit: Payer: Medicare Other

## 2013-07-06 ENCOUNTER — Encounter: Payer: Self-pay | Admitting: Cardiovascular Disease

## 2013-07-06 ENCOUNTER — Ambulatory Visit (INDEPENDENT_AMBULATORY_CARE_PROVIDER_SITE_OTHER): Payer: Medicare Other | Admitting: Cardiovascular Disease

## 2013-07-06 VITALS — BP 132/64 | HR 82 | Ht 62.0 in | Wt 128.8 lb

## 2013-07-06 DIAGNOSIS — E785 Hyperlipidemia, unspecified: Secondary | ICD-10-CM

## 2013-07-06 DIAGNOSIS — I10 Essential (primary) hypertension: Secondary | ICD-10-CM

## 2013-07-06 LAB — LIPID PANEL
Cholesterol: 149 mg/dL (ref 0–200)
LDL Cholesterol: 64 mg/dL (ref 0–99)
Total CHOL/HDL Ratio: 2

## 2013-07-06 LAB — BASIC METABOLIC PANEL
BUN: 19 mg/dL (ref 6–23)
CO2: 27 mEq/L (ref 19–32)
Calcium: 9.2 mg/dL (ref 8.4–10.5)
Chloride: 103 mEq/L (ref 96–112)
Creatinine, Ser: 0.9 mg/dL (ref 0.4–1.2)

## 2013-07-06 LAB — HEPATIC FUNCTION PANEL
ALT: 21 U/L (ref 0–35)
AST: 25 U/L (ref 0–37)
Alkaline Phosphatase: 60 U/L (ref 39–117)
Bilirubin, Direct: 0.1 mg/dL (ref 0.0–0.3)
Total Bilirubin: 0.7 mg/dL (ref 0.3–1.2)

## 2013-07-06 NOTE — Patient Instructions (Signed)
Your physician recommends that you return for lab work in: today   Your physician wants you to follow-up in: 6 months You will receive a reminder letter in the mail two months in advance. If you don't receive a letter, please call our office to schedule the follow-up appointment.  Your physician recommends that you return for a FASTING lipid profile: 6 months

## 2013-07-06 NOTE — Assessment & Plan Note (Signed)
Shawna Doyle's blood pressure is well-controlled. She's feeling well now. She did have an episode of presyncope several months ago. She was thought to be volume depleted. She is to drink more water. She's not had any further episodes of presyncope recently.  We'll check labs today including a basic metabolic profile, hepatic profile, and lipid.  Will see her in 6 months.

## 2013-07-06 NOTE — Progress Notes (Signed)
Shawna Doyle Date of Birth  21-Aug-1928 [facility]     [place]  [address]    [address]   [address] [place], [place]  [address]    [place], [place]  [address] [phone]  Fax  [phone]  [phone]  Fax [phone]  Problems 1. Hypertension 2. Hyperlipidemia   History of Present Illness:  Shawna Doyle is an 79 yo with a hx of hypertension and hyperlipidemia.  She has done well since I last saw her.  She denies any chest pain or dyspnea.   She has been exercising regularly - she goes to the gym.  She has been having difficulty with her right knee and his been getting injections.  She deines any chest pain or dyspnea.  January 15, 2013:  Shawna Doyle is doing well.  She is able to do all of her normal activities without problems.  She did have some weakness after doing her laundry.  She felt better after eating and drinking and taking a rest.   No CP , no dyspnea.    Dec. 16, 2014:  She had a syncopal episode while at church in Oct.  We to the ER .  They thought it was volume depletion.  She knows that she does not drink enough.  No further episodes.  , no CP , no dyspnea.   Current Outpatient Prescriptions on File Prior to Visit  Medication Sig Dispense Refill  . alendronate (FOSAMAX) 70 MG tablet Take 70 mg by mouth every 7 (seven) days. Take with a full glass of water on an empty stomach.       Marland Kitchen aspirin 81 MG tablet Take 81 mg by mouth daily.      Marland Kitchen atorvastatin (LIPITOR) 20 MG tablet Take 1 tablet (20 mg total) by mouth daily.  30 tablet  5  . Calcium Carb-Cholecalciferol (CALCIUM 600+D3 PO) Take by mouth 2 (two) times daily before a meal.        . Cholecalciferol (VITAMIN D PO) Take 1,000 mg by mouth daily.        . folic acid (FOLVITE) 1 MG tablet Take 1 mg by mouth daily.      Marland Kitchen losartan-hydrochlorothiazide (HYZAAR) 100-12.5 MG per tablet Take 1 tablet by mouth daily.        . methotrexate (RHEUMATREX) 2.5 MG tablet Take 7.5 mg by mouth  once a week. Every Thursday Caution:Chemotherapy. Protect from light.      . Multiple Vitamin (MULTIVITAMIN) tablet Take 1 tablet by mouth daily.        . TRAVATAN Z 0.004 % SOLN ophthalmic solution Place 1 drop into both eyes at bedtime.       No current facility-administered medications on file prior to visit.    Allergies  Allergen Reactions  . Flagyl [Metronidazole Hcl]   . Sulfur     Past Medical History  Diagnosis Date  . Hypertension   . Hyperlipidemia   . Chest pain   . GERD (gastroesophageal reflux disease)   . Tremor     Tremor in right arm, Pt not sure why  . Parkinsonism 06/01/2013    Past Surgical History  Procedure Laterality Date  . Appendectomy    . Tonsillectomy    . Cardiovascular stress test  05/29/2005    EF 80%    History  Smoking status  . Never Smoker   Smokeless tobacco  . Not on file    History  Alcohol Use No  Family History  Problem Relation Age of Onset  . Heart attack Mother   . Heart attack Father   . Heart attack Brother     Reviw of Systems:  Reviewed in the HPI.  All other systems are negative.  Physical Exam: BP 132/64  Pulse 82  Ht 5\' 2"  (1.575 m)  Wt 128 lb 12.8 oz (58.423 kg)  BMI 23.55 kg/m2 The patient is alert and oriented x 3.  The mood and affect are normal.   Skin: warm and dry.  Color is normal.    HEENT:   Neck is supple, no JVD, normal carotids  Lungs: clear   Heart: RR, S1 , S2    Abdomen: + BS, nontender., no HSM  Extremities:  No c/c/e  Neuro:  Nonfocal    ECG: January 15, 2013:  NSR at 91[employer] RBBB.    Assessment / Plan:

## 2013-07-08 DIAGNOSIS — H409 Unspecified glaucoma: Secondary | ICD-10-CM | POA: Diagnosis not present

## 2013-07-08 DIAGNOSIS — H4011X0 Primary open-angle glaucoma, stage unspecified: Secondary | ICD-10-CM | POA: Diagnosis not present

## 2013-07-28 ENCOUNTER — Ambulatory Visit
Admission: RE | Admit: 2013-07-28 | Discharge: 2013-07-28 | Disposition: A | Discharge status: Home / Self Care | Payer: Medicare Other | Source: Ambulatory Visit

## 2013-07-28 DIAGNOSIS — Z1231 Encounter for screening mammogram for malignant neoplasm of breast: Secondary | ICD-10-CM | POA: Diagnosis not present

## 2013-08-03 ENCOUNTER — Other Ambulatory Visit: Payer: Self-pay | Admitting: Family Medicine

## 2013-08-03 DIAGNOSIS — R928 Other abnormal and inconclusive findings on diagnostic imaging of breast: Secondary | ICD-10-CM

## 2013-08-11 ENCOUNTER — Other Ambulatory Visit: Payer: Self-pay | Admitting: Cardiovascular Disease

## 2013-08-11 ENCOUNTER — Ambulatory Visit
Admission: RE | Admit: 2013-08-11 | Discharge: 2013-08-11 | Disposition: A | Discharge status: Home / Self Care | Payer: Medicare Other | Source: Ambulatory Visit | Attending: Family Medicine | Admitting: Family Medicine

## 2013-08-11 DIAGNOSIS — R928 Other abnormal and inconclusive findings on diagnostic imaging of breast: Secondary | ICD-10-CM

## 2013-09-09 DIAGNOSIS — M949 Disorder of cartilage, unspecified: Secondary | ICD-10-CM | POA: Diagnosis not present

## 2013-09-09 DIAGNOSIS — M899 Disorder of bone, unspecified: Secondary | ICD-10-CM | POA: Diagnosis not present

## 2013-09-14 DIAGNOSIS — M069 Rheumatoid arthritis, unspecified: Secondary | ICD-10-CM | POA: Diagnosis not present

## 2013-10-15 DIAGNOSIS — M722 Plantar fascial fibromatosis: Secondary | ICD-10-CM | POA: Diagnosis not present

## 2013-10-21 ENCOUNTER — Encounter: Payer: Self-pay | Admitting: Cardiovascular Disease

## 2013-10-21 DIAGNOSIS — Z0000 Encounter for general adult medical examination without abnormal findings: Secondary | ICD-10-CM | POA: Diagnosis not present

## 2013-10-21 DIAGNOSIS — E7800 Pure hypercholesterolemia, unspecified: Secondary | ICD-10-CM | POA: Diagnosis not present

## 2013-10-21 DIAGNOSIS — Z23 Encounter for immunization: Secondary | ICD-10-CM | POA: Diagnosis not present

## 2013-10-21 DIAGNOSIS — K219 Gastro-esophageal reflux disease without esophagitis: Secondary | ICD-10-CM | POA: Diagnosis not present

## 2013-10-21 DIAGNOSIS — M1990 Unspecified osteoarthritis, unspecified site: Secondary | ICD-10-CM | POA: Diagnosis not present

## 2013-10-21 DIAGNOSIS — M899 Disorder of bone, unspecified: Secondary | ICD-10-CM | POA: Diagnosis not present

## 2013-10-21 DIAGNOSIS — G20 Parkinson's disease: Secondary | ICD-10-CM | POA: Diagnosis not present

## 2013-10-21 DIAGNOSIS — I2510 Atherosclerotic heart disease of native coronary artery without angina pectoris: Secondary | ICD-10-CM | POA: Diagnosis not present

## 2013-10-21 DIAGNOSIS — I10 Essential (primary) hypertension: Secondary | ICD-10-CM | POA: Diagnosis not present

## 2013-10-28 DIAGNOSIS — H5200 Hypermetropia, unspecified eye: Secondary | ICD-10-CM | POA: Diagnosis not present

## 2013-10-28 DIAGNOSIS — H52229 Regular astigmatism, unspecified eye: Secondary | ICD-10-CM | POA: Diagnosis not present

## 2013-10-28 DIAGNOSIS — H4010X0 Unspecified open-angle glaucoma, stage unspecified: Secondary | ICD-10-CM | POA: Diagnosis not present

## 2013-10-28 DIAGNOSIS — H18509 Unspecified hereditary corneal dystrophies, unspecified eye: Secondary | ICD-10-CM | POA: Diagnosis not present

## 2013-11-30 ENCOUNTER — Ambulatory Visit (INDEPENDENT_AMBULATORY_CARE_PROVIDER_SITE_OTHER): Payer: Medicare Other | Admitting: Neurology

## 2013-11-30 ENCOUNTER — Encounter: Payer: Self-pay | Admitting: Neurology

## 2013-11-30 VITALS — BP 144/67 | HR 73 | Temp 97.0°F | Ht 62.0 in | Wt 127.0 lb

## 2013-11-30 DIAGNOSIS — G20 Parkinson's disease: Secondary | ICD-10-CM | POA: Diagnosis not present

## 2013-11-30 MED ORDER — CARBIDOPA-LEVODOPA 25-100 MG PO TABS: ORAL_TABLET | ORAL | Status: DC

## 2013-11-30 NOTE — Patient Instructions (Signed)
I would like for you to start a medication for mild parkinsonism called Sinemet (generic name: carbidopa-levodopa) 25/100 mg: Take half a pill twice daily (8 AM and noon) for one week, then half a pill 3 times a day (8 AM, noon, and 4 PM) for one week, then one pill 3 times a day thereafter. Please try to take the medication away from you mealtimes, that is, ideally either one hour before or 2 hours after your meal to ensure optimal absorption. The medication can interfere with the protein content of your meal and trying to the protein in your food and therefore not get fully absorbed.  Common side effects reported are: Nausea, vomiting, sedation, confusion, lightheadedness. Rare side effects include hallucinations, severe nausea or vomiting, diarrhea and significant drop in blood pressure especially when going from lying to standing or from sitting to standing.

## 2013-11-30 NOTE — Progress Notes (Signed)
Subjective:    Patient ID: Shawna Doyle is a 80 y.o. female.  HPI    Interim history:   Shawna Doyle is a very pleasant 80 year old right-handed woman with an underlying medical history of hyperlipidemia, hypertension, colonic polyps, heart disease, diverticulitis, osteopenia and vitamin D deficiency who presents for followup consultation of her parkinsonism. She is accompanied by her daughter and 73 yo GD today. I last saw her on 06/01/2013, at which time I felt her exam was consistent with mild parkinsonism, possibly right ear Parkinson's disease. I felt she was stable. She reported a fainting spell and I felt she had a vasovagal syncope. She reported a tendency not to drink enough fluid and often skipping her lunch. I advised her strongly to keep well hydrated and not to skip any meals. I asked her to exercise regularly. I did not suggest any new medications especially in light of her recent syncopal spell and possible side effects from parkinsonian medication. I asked her to drive only locally and then 35 miles zones, during the daytime and avoid interstates.   Today, she reports she has had a harder time stirring with her R hand and rolling the dough and she has more tremor and her handwriting is worse. She has been stable with her memory and her mood. She has not been driving at night.   I first met her on 02/11/2013, and which time I suggested watchful waiting as far as her parkinsonian symptoms and started no new medications. I did suggest a brain MRI. She had a brain MRI without contrast on 03/13/2013 and I reviewed the results: Abnormal MRI scan of the brain showing mild changes of chronic microvascular ischemia and generalized cerebral atrophy. We called her with the results.   In the interim, on 05/02/2013 she presented to the emergency room after a brief episode of syncope while at church. She felt lightheaded and had a brief passing out spell but someone caught her and was  told she was out for about 5 seconds. She did not injure herself. She indicated having had a good breakfast, but not drinking enough fluid. She did have a plain head CT on 05/02/2013: No intracranial hemorrhage. Small vessel disease type changes without CT evidence of large acute infarct. Mild atrophy without hydrocephalus. It was felt that she most likely had a vasovagal syncope. It was suggested that she stay overnight for further observation and workup she declined. She was not found to be orthostatic.  She had noted a right arm tremor and change in her handwriting. She denied recurrent headaches changes with her speech or swallowing, changes in her gait or any other focal neurologic problems. She has had some loss of appetite and has lost some weight. There is no family history of Parkinson's disease or tremors and there is no personal history of neuroleptic medication use, no pesticide or toxin exposures.  Her family had noted the tremor. She fell in 2014 while walking the dog, and fell on the road, no Fx, no head injury, no LOC. She has not noted any memory loss, mood d/o, hallucinations, or recurrent HAs. She has been driving, not at night and tries to avoid the interstate. She lives in an apartment and does her own groceries, cleaning, cooking. She lives alone and no longer keeps her GD's dog.   Her Past Medical History Is Significant For: Past Medical History  Diagnosis Date  . Hypertension   . Hyperlipidemia   . Chest pain   . GERD (  gastroesophageal reflux disease)   . Tremor     Tremor in right arm, Pt not sure why  . Parkinsonism 06/01/2013    Her Past Surgical History Is Significant For: Past Surgical History  Procedure Laterality Date  . Appendectomy    . Tonsillectomy    . Cardiovascular stress test  05/29/2005    EF 80%    Her Family History Is Significant For: Family History  Problem Relation Age of Onset  . Heart attack Mother   . Heart attack Father   . Heart attack  Brother     Her Social History Is Significant For: History   Social History  . Marital Status: Widowed    Spouse Name: N/A    Number of Children: N/A  . Years of Education: N/A   Social History Main Topics  . Smoking status: Never Smoker   . Smokeless tobacco: None  . Alcohol Use: No  . Drug Use: No  . Sexual Activity: None   Other Topics Concern  . None   Social History Narrative  . None    Her Allergies Are:  Allergies  Allergen Reactions  . Flagyl [Metronidazole Hcl]   . Sulfur   :   Her Current Medications Are:  Outpatient Encounter Prescriptions as of 11/30/2013  Medication Sig  . alendronate (FOSAMAX) 70 MG tablet Take 70 mg by mouth every 7 (seven) days. Take with a full glass of water on an empty stomach.   Marland Kitchen aspirin 81 MG tablet Take 81 mg by mouth daily.  Marland Kitchen atorvastatin (LIPITOR) 20 MG tablet TAKE ONE TABLET BY MOUTH ONCE DAILY  . Calcium Carb-Cholecalciferol (CALCIUM 600+D3 PO) Take by mouth 2 (two) times daily before a meal.    . Cholecalciferol (VITAMIN D PO) Take 1,000 mg by mouth daily.    . folic acid (FOLVITE) 1 MG tablet Take 1 mg by mouth daily.  Marland Kitchen losartan-hydrochlorothiazide (HYZAAR) 100-12.5 MG per tablet Take 1 tablet by mouth daily.    . methotrexate (RHEUMATREX) 2.5 MG tablet Take 7.5 mg by mouth once a week. Every Thursday Caution:Chemotherapy. Protect from light.  . Multiple Vitamin (MULTIVITAMIN) tablet Take 1 tablet by mouth daily.    Marland Kitchen PREVIDENT 5000 BOOSTER PLUS 1.1 % PSTE Place 1 application onto teeth daily.  . TRAVATAN Z 0.004 % SOLN ophthalmic solution Place 1 drop into both eyes at bedtime.  . [DISCONTINUED] methotrexate (RHEUMATREX) 2.5 MG tablet Take 2.5 mg by mouth 3 (three) times a week. 3 tabs once a week  :  Review of Systems:  Out of a complete 14 point review of systems, all are reviewed and negative with the exception of these symptoms as listed below:   Review of Systems  Constitutional: Negative.   HENT: Negative.    Eyes: Negative.   Respiratory: Negative.   Cardiovascular: Negative.   Gastrointestinal: Negative.   Endocrine: Negative.   Genitourinary: Negative.   Musculoskeletal: Negative.   Skin: Negative.   Allergic/Immunologic: Negative.   Neurological: Positive for tremors.  Hematological: Negative.   Psychiatric/Behavioral: Negative.     Objective:  Neurologic Exam  Physical Exam Physical Examination:   Filed Vitals:   11/30/13 1448  BP: 144/67  Pulse: 73  Temp: 97 F (36.1 C)   General Examination: The patient is a very pleasant 80 y.o. female in no acute distress. She is able to provide history very well. She is in good spirits. She is well-groomed. She is situated comfortably in the chair.   HEENT: Normocephalic,  atraumatic, pupils are equal, round and reactive to light and accommodation. Funduscopic exam is normal with sharp disc margins noted. Extraocular tracking shows mild saccadic breakdown without nystagmus noted. There is limitation to upper gaze. There is no decrease in eye blink rate. Hearing is impaired mildly; she has bilateral hearing aids.  Face is symmetric with minimal facial masking and normal facial sensation. There is no lip, neck or jaw tremor. Neck is moderately rigid with intact passive ROM. There are no carotid bruits on auscultation. Oropharynx exam reveals mild mouth dryness. No significant airway crowding is noted. Mallampati is class II. Tongue protrudes centrally and palate elevates symmetrically. There is no drooling.   Chest: is clear to auscultation without wheezing, rhonchi or crackles noted.  Heart: sounds are regular and normal without murmurs, rubs or gallops noted.   Abdomen: is soft, non-tender and non-distended with normal bowel sounds appreciated on auscultation.  Extremities: There is no pitting edema in the distal lower extremities bilaterally. Pedal pulses are intact. There are no varicose veins.  Skin: is warm and dry with no trophic  changes noted. Age-related changes are noted on the skin.   Musculoskeletal: exam reveals no obvious joint deformities other than arthritic changes to her hands, no tenderness, joint swelling or erythema.  Neurologically:  Mental status: The patient is awake and alert, paying good attention. She is able to completely provide the history. She is oriented to: person, place, time/date, situation, day of week, month of year and year. Her memory, attention, language and knowledge are intact. There is no aphasia, agnosia, apraxia or anomia. There is a no bradyphrenia. Speech is mildly hypophonic with no dysarthria noted. Mood is congruent and affect is normal.    Cranial nerves are as described above under HEENT exam. In addition, shoulder shrug is normal with equal shoulder height noted.  Motor exam: Normal bulk, and strength for age is noted. There are no dyskinesias noted. Tone is minimally rigid on the RUE with presence of cogwheeling in the right upper extremity. There is overall minimal bradykinesia. There is no drift or rebound.  There is a very mild resting tremor in the right upper extremity and a no other resting tremor. The tremor is constant today. There is a minimal postural component, no action tremor. Romberg is negative.  Reflexes are 2+ in the upper extremities and 1+ in the lower extremities.     Fine motor skills exam: Finger taps are moderately impaired on the right and mildly impaired on the left. Hand movements are mildly impaired on the right and not impaired on the left. RAP (rapid alternating patting) is mildly impaired on the right and minimally impaired on the left. Foot taps are moderately impaired on the right and mildly impaired on the left. Foot agility (in the form of heel stomping) is moderately impaired on the right and mildly impaired on the left.    Cerebellar testing shows no dysmetria or intention tremor on finger to nose testing. Heel to shin is unremarkable  bilaterally. There is no truncal or gait ataxia.   Sensory exam is intact to light touch, pinprick, vibration, temperature sense in the upper and lower extremities.   Gait, station and balance: She stands up from the seated position with very mild difficulty and does not need to push up with Her hands. She needs no assistance. No veering to one side is noted. She is not noted to lean to the side. Posture is mildly stooped, possibly age-appropriate. Stance is narrow-based. She  walks with decrease in stride length and pace and decreased arm swing on the right. She turns in 3 steps. Tandem walk is not possible. Balance is mildly impaired. She does not report any lightheadedness upon standing.   Assessment and Plan:   In summary, DONDI BURANDT is a very pleasant 80 year old female with a history and exam consistent with mild parkinsonism, possibly R sided Parkinson's disease. She has had mild progression. I do believe that it may be worthwhile starting on Sinemet for symptomatic treatment of her Parkinson's disease and talked to her and her family at length about this medication and its potential benefits, as well as side effects. I've asked her to start on Sinemet 25-100 mg strength half a pill twice daily and with slow titration to up to one pill 3 times a day. She is advised about potential side effects in particular nausea, lightheadedness and hallucinations, she is advised to take the medication preferably on an empty stomach to allow better absorption. I have encouraged her to increase her water intake and again reminded her not to skip any meals. I did ask her to continue to stay active. As far short driving, I encouraged her to drive on local roads only and not at night and not on the Interstate but I've also asked her daughter to sit with her and see if she feels comfortable with her mother's driving. I would like to see the patient back in 3 months from now for reassessment after the medication  has been started and encouraged her to call with any interim questions, concerns, problems or updates. She was in agreement.

## 2013-12-14 DIAGNOSIS — M069 Rheumatoid arthritis, unspecified: Secondary | ICD-10-CM | POA: Diagnosis not present

## 2013-12-20 ENCOUNTER — Telehealth: Payer: Self-pay | Admitting: Neurology

## 2013-12-20 NOTE — Telephone Encounter (Signed)
Started taking one tablet of carbidopa-levodopa (SINEMET IR) 25-100 MG per tablet and it is  Making her constipated she would like to know if she can go back to a half of tablet.

## 2013-12-20 NOTE — Telephone Encounter (Signed)
Pt is calling stating that she started taking Sinemet IR 25-100 mg, one tablet and pt states that its making her constipated, pt wants to know if she can decrease the dosage to just a half of tablet. Dr. Frances Furbish is out of the office, sending to [facility], Dr. Vickey Huger. Please advise

## 2013-12-20 NOTE — Telephone Encounter (Signed)
Yes she can go slower, so 1 tab tid is the rule, but she may try 1/2 tab tid. Po, CD

## 2013-12-20 NOTE — Telephone Encounter (Signed)
Called pt to inform her per Dr. Vickey Huger, [facility], that the pt can decrease dosage of Sinemet IR 25-100 mg, taking 1/2 tablet three times daily. I advised that pt that if she has any other problems, questions or concerns to call the office. Pt verbalized understanding.

## 2014-01-18 ENCOUNTER — Ambulatory Visit (INDEPENDENT_AMBULATORY_CARE_PROVIDER_SITE_OTHER): Payer: Medicare Other | Admitting: Cardiovascular Disease

## 2014-01-18 ENCOUNTER — Other Ambulatory Visit: Payer: Medicare Other

## 2014-01-18 ENCOUNTER — Encounter: Payer: Self-pay | Admitting: Cardiovascular Disease

## 2014-01-18 VITALS — BP 110/66 | HR 60 | Ht 62.0 in | Wt 128.8 lb

## 2014-01-18 DIAGNOSIS — I10 Essential (primary) hypertension: Secondary | ICD-10-CM

## 2014-01-18 DIAGNOSIS — E785 Hyperlipidemia, unspecified: Secondary | ICD-10-CM

## 2014-01-18 LAB — LIPID PANEL
Cholesterol: 150 mg/dL (ref 0–200)
HDL: 75.1 mg/dL (ref >39.00)
LDL Cholesterol: 61 mg/dL (ref 0–99)
NonHDL: 74.9
Total CHOL/HDL Ratio: 2
Triglycerides: 68 mg/dL (ref 0.0–149.0)
VLDL: 13.6 mg/dL (ref 0.0–40.0)

## 2014-01-18 LAB — BASIC METABOLIC PANEL
BUN: 12 mg/dL (ref 6–23)
CO2: 29 mEq/L (ref 19–32)
Calcium: 9.1 mg/dL (ref 8.4–10.5)
Chloride: 103 mEq/L (ref 96–112)
Creatinine, Ser: 0.9 mg/dL (ref 0.4–1.2)
GFR: 61.95 mL/min (ref >60.00)
Glucose, Bld: 101 mg/dL — ABNORMAL HIGH (ref 70–99)
Potassium: 3.6 mEq/L (ref 3.5–5.1)
Sodium: 138 mEq/L (ref 135–145)

## 2014-01-18 LAB — HEPATIC FUNCTION PANEL
ALK PHOS: 63 U/L (ref 39–117)
ALT: 8 U/L (ref 0–35)
AST: 26 U/L (ref 0–37)
Albumin: 3.9 g/dL (ref 3.5–5.2)
BILIRUBIN TOTAL: 0.7 mg/dL (ref 0.2–1.2)
Bilirubin, Direct: 0 mg/dL (ref 0.0–0.3)
Total Protein: 7.2 g/dL (ref 6.0–8.3)

## 2014-01-18 NOTE — Progress Notes (Signed)
Shawna Doyle Date of Birth  Feb 25, 1929 [facility]     [place]  [address]    [address]   [address] [place], [place]  [address]    [place], [place]  [address] [phone]  Fax  [phone]  [phone]  Fax [phone]  Problems 1. Hypertension 2. Hyperlipidemia   History of Present Illness:  Shawna Doyle is an 80 yo with a hx of hypertension and hyperlipidemia.  She has done well since I last saw her.  She denies any chest pain or dyspnea.   She has been exercising regularly - she goes to the gym.  She has been having difficulty with her right knee and his been getting injections.  She deines any chest pain or dyspnea.  January 15, 2013:  Shawna Doyle is doing well.  She is able to do all of her normal activities without problems.  She did have some weakness after doing her laundry.  She felt better after eating and drinking and taking a rest.   No CP , no dyspnea.    Dec. 16, 2014:  She had a syncopal episode while at church in Oct.  We to the ER .  They thought it was volume depletion.  She knows that she does not drink enough.  No further episodes.  , no CP , no dyspnea.   January 18, 2014:  Shawna Doyle is doing well.  Still exercising regularly.   No further syncopal episodes.  She is eating and drinking better.   She has been diagnosed with Parkinsons - has a tremor in her right hand primarily.  Current Outpatient Prescriptions on File Prior to Visit  Medication Sig Dispense Refill  . alendronate (FOSAMAX) 70 MG tablet Take 70 mg by mouth every 7 (seven) days. Take with a full glass of water on an empty stomach.       Marland Kitchen aspirin 81 MG tablet Take 81 mg by mouth daily.      Marland Kitchen atorvastatin (LIPITOR) 20 MG tablet TAKE ONE TABLET BY MOUTH ONCE DAILY  30 tablet  5  . Calcium Carb-Cholecalciferol (CALCIUM 600+D3 PO) Take by mouth 2 (two) times daily before a meal.        . Cholecalciferol (VITAMIN D PO) Take 1,000 mg by mouth daily.        . folic  acid (FOLVITE) 1 MG tablet Take 1 mg by mouth daily.      Marland Kitchen losartan-hydrochlorothiazide (HYZAAR) 100-12.5 MG per tablet Take 1 tablet by mouth daily.        . methotrexate (RHEUMATREX) 2.5 MG tablet Take 7.5 mg by mouth once a week. Every Thursday Caution:Chemotherapy. Protect from light.      . Multiple Vitamin (MULTIVITAMIN) tablet Take 1 tablet by mouth daily.        Marland Kitchen PREVIDENT 5000 BOOSTER PLUS 1.1 % PSTE Place 1 application onto teeth daily.      . TRAVATAN Z 0.004 % SOLN ophthalmic solution Place 1 drop into both eyes at bedtime.       No current facility-administered medications on file prior to visit.    Allergies  Allergen Reactions  . Flagyl [Metronidazole Hcl]   . Sulfur     Past Medical History  Diagnosis Date  . Hypertension   . Hyperlipidemia   . Chest pain   . GERD (gastroesophageal reflux disease)   . Tremor     Tremor in right arm, Pt not sure why  . Parkinsonism 06/01/2013  Past Surgical History  Procedure Laterality Date  . Appendectomy    . Tonsillectomy    . Cardiovascular stress test  05/29/2005    EF 80%    History  Smoking status  . Never Smoker   Smokeless tobacco  . Not on file    History  Alcohol Use No    Family History  Problem Relation Age of Onset  . Heart attack Mother   . Heart attack Father   . Heart attack Brother     Reviw of Systems:  Reviewed in the HPI.  All other systems are negative.  Physical Exam: BP 110/66  Pulse 60  Ht 5\' 2"  (1.575 m)  Wt 128 lb 12.8 oz (58.423 kg)  BMI 23.55 kg/m2 The patient is alert and oriented x 3.  The mood and affect are normal.   Skin: warm and dry.  Color is normal.    HEENT:   Neck is supple, no JVD, normal carotids  Lungs: clear   Heart: RR, S1 , S2    Abdomen: + BS, nontender., no HSM  Extremities:  No c/c/e  Neuro:  Nonfocal    ECG: January 15, 2013:  NSR at 91[employer] RBBB.    Assessment / Plan:

## 2014-01-18 NOTE — Patient Instructions (Signed)
Your physician recommends that you continue on your current medications as directed. Please refer to the Current Medication list given to you today.  Your physician wants you to follow-up in: 1 year with Dr. Nahser.  You will receive a reminder letter in the mail two months in advance. If you don't receive a letter, please call our office to schedule the follow-up appointment. Your physician recommends that you return for lab work in: 12 months on the day of or a few days before your office visit with Dr. Nahser.  You will need to FAST for this appointment - nothing to eat or drink after midnight the night before except water.   

## 2014-01-18 NOTE — Assessment & Plan Note (Signed)
Continue with her current medications. I'll see her again in one year. We'll check fasting lipids, liver enzymes, basic metabolic profile at that time. She's also followed by Dr. Maurice Small.

## 2014-01-18 NOTE — Assessment & Plan Note (Signed)
Her blood pressure seems to be well-controlled. Continue with her current medications. I'll see her again in one year.

## 2014-01-24 DIAGNOSIS — H6120 Impacted cerumen, unspecified ear: Secondary | ICD-10-CM | POA: Diagnosis not present

## 2014-01-24 DIAGNOSIS — S0990XA Unspecified injury of head, initial encounter: Secondary | ICD-10-CM | POA: Diagnosis not present

## 2014-02-09 ENCOUNTER — Other Ambulatory Visit: Payer: Self-pay | Admitting: Cardiovascular Disease

## 2014-02-11 IMAGING — CT CT HEAD W/O CM
2 series · 16 of 30 positions shown, 20 images · non-contrast
Comparison: 03/11/2013 MR.

CLINICAL DATA: Hypertensive hyperlipidemic patient with episode of
loss of consciousness for 5 seconds.

EXAM:
CT HEAD WITHOUT CONTRAST
TECHNIQUE: Contiguous axial images were obtained from the base of the skull
through the vertex without intravenous contrast.

[Series 2: head w/o · axial · non-contrast · 0.43mm/px · z∈[+77,+207]mm · 13 of 32 slices shown, 17 images]
[im 3/32  brain]
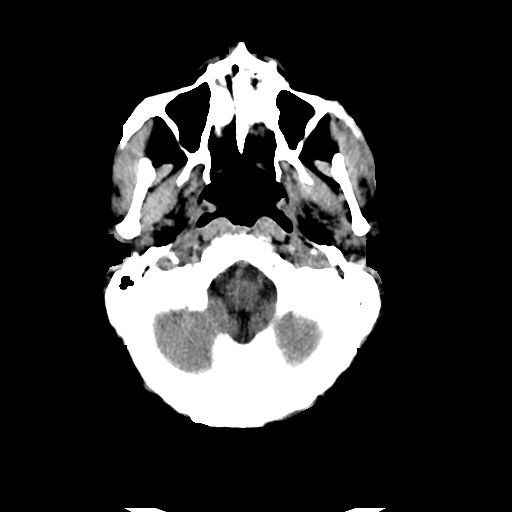
[im 3/32  bone]
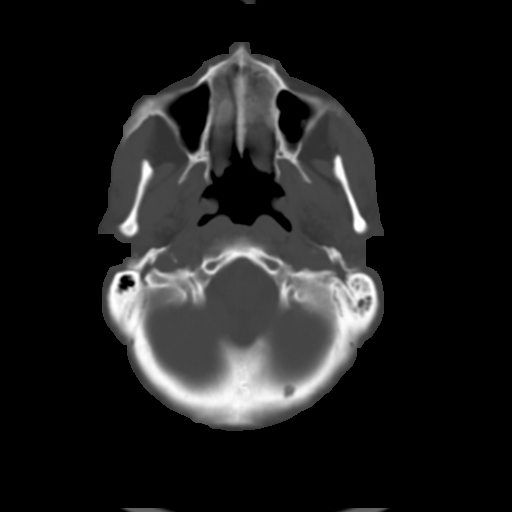
[im 5/32  brain]
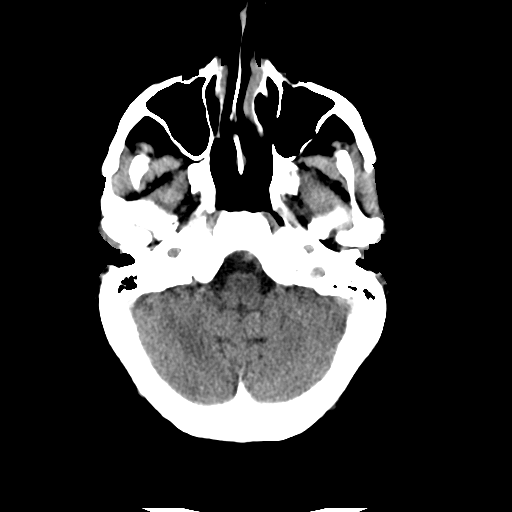
[im 7/32  brain]
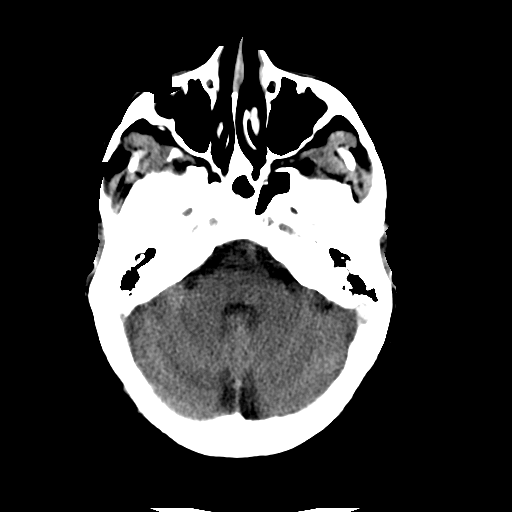
[im 9/32  brain]
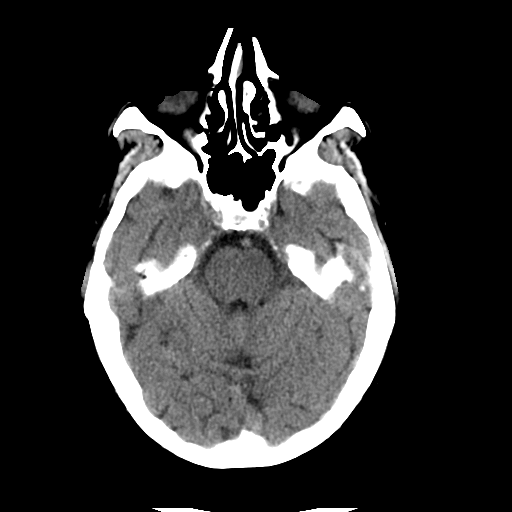
[im 12/32  brain]
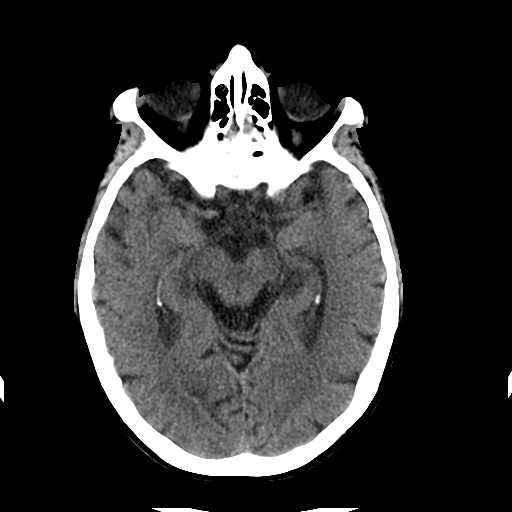
[im 12/32  bone]
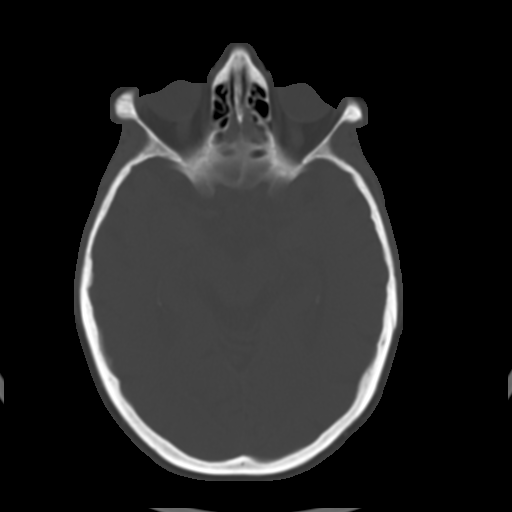
[im 14/32  brain]
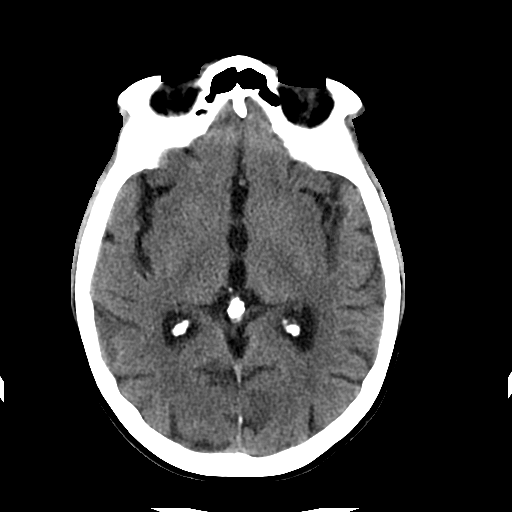
[im 16/32  brain]
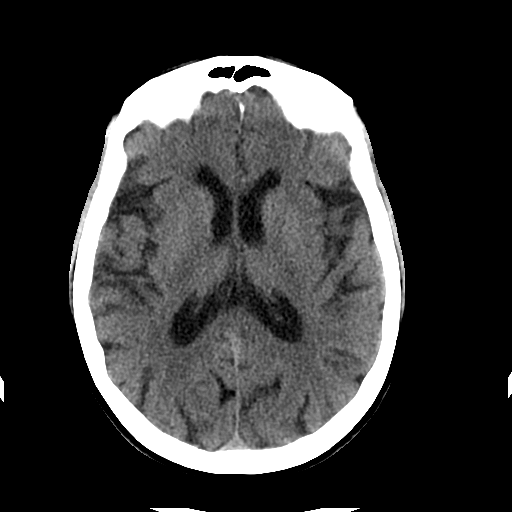
[im 18/32  brain]
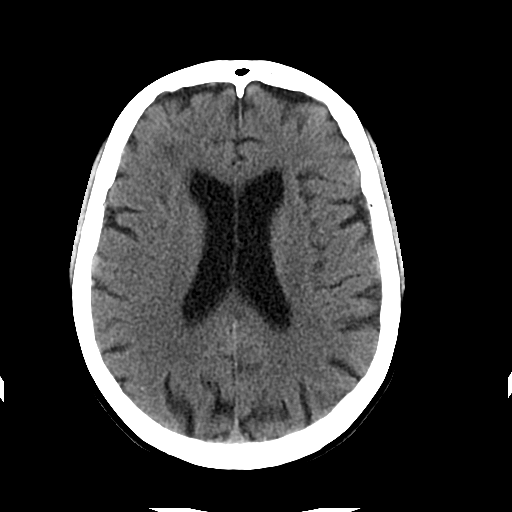
[im 20/32  brain]
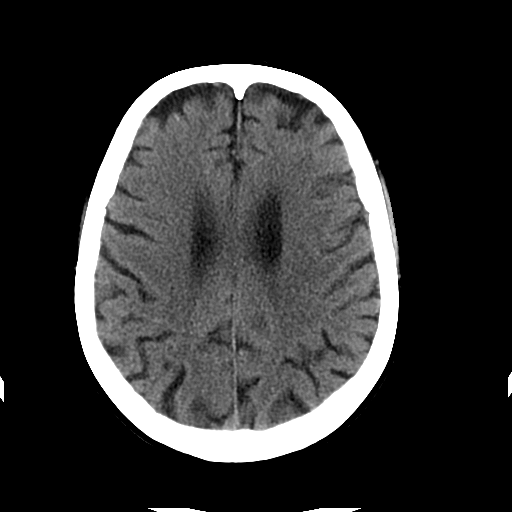
[im 20/32  bone]
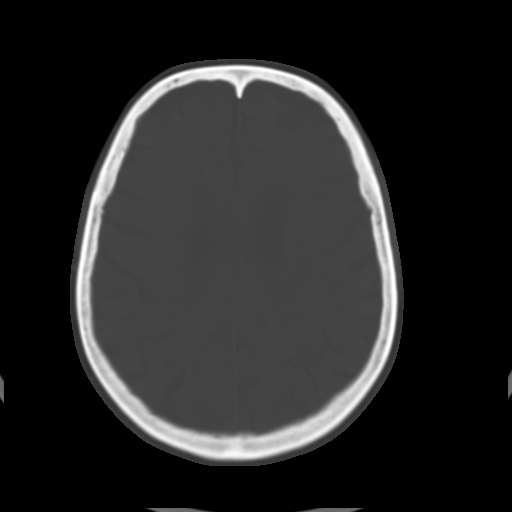
[im 23/32  brain]
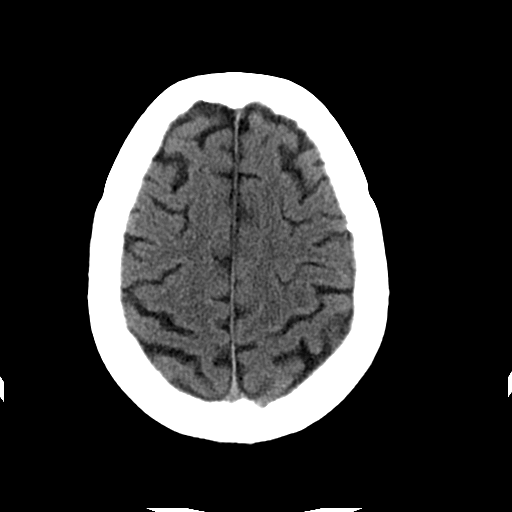
[im 25/32  brain]
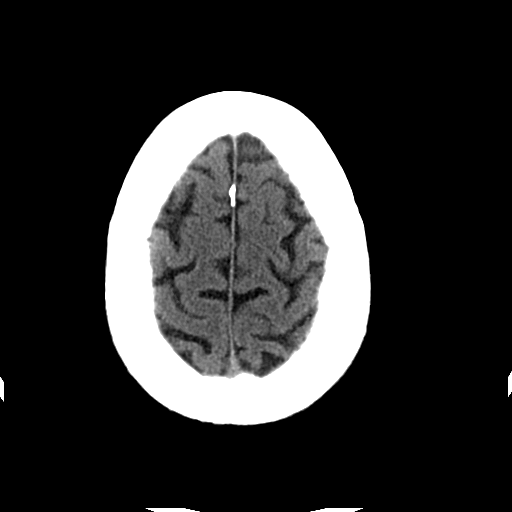
[im 27/32  brain]
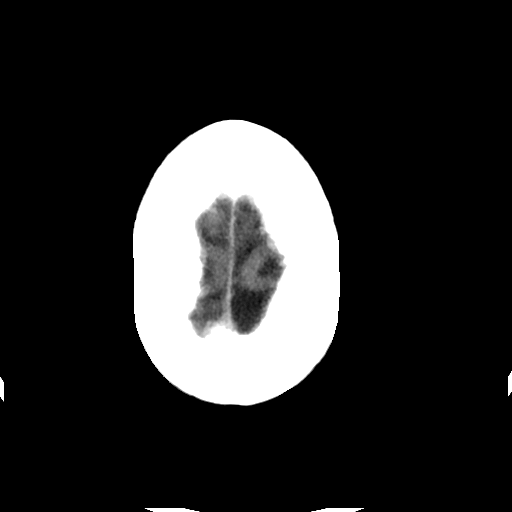
[im 29/32  brain]
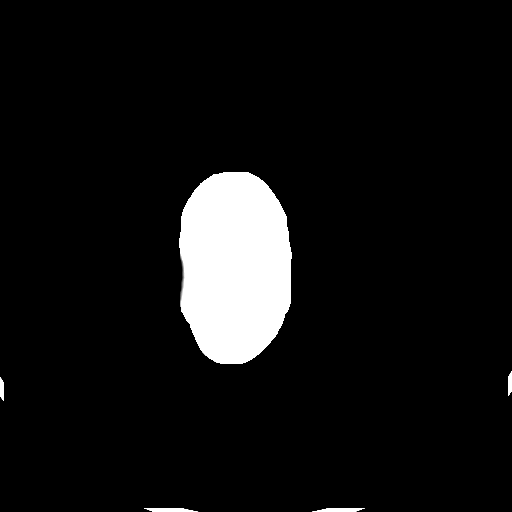
[im 29/32  bone]
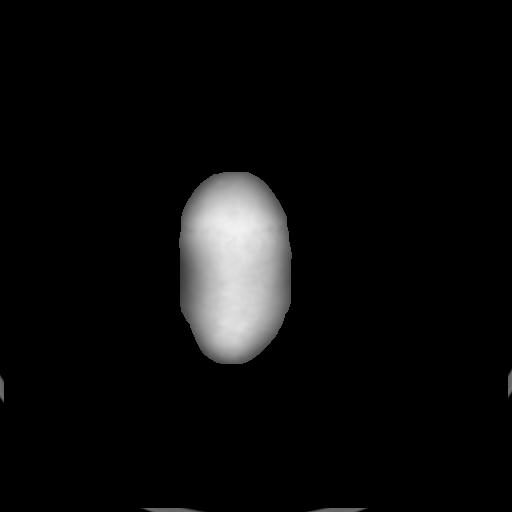

[Series 3: head w/o bone · axial · non-contrast · 0.43mm/px · z∈[+77,+107]mm · 3 of 32 slices shown]
[im 3/32  bone]
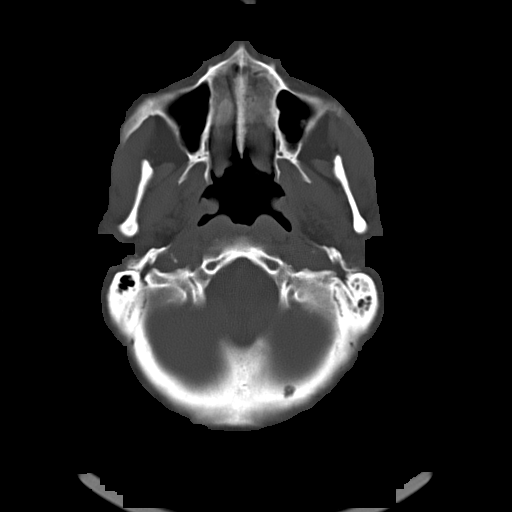
[im 7/32  bone]
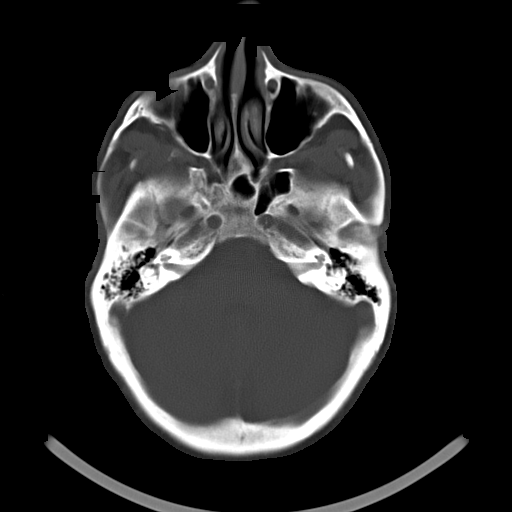
[im 9/32  bone]
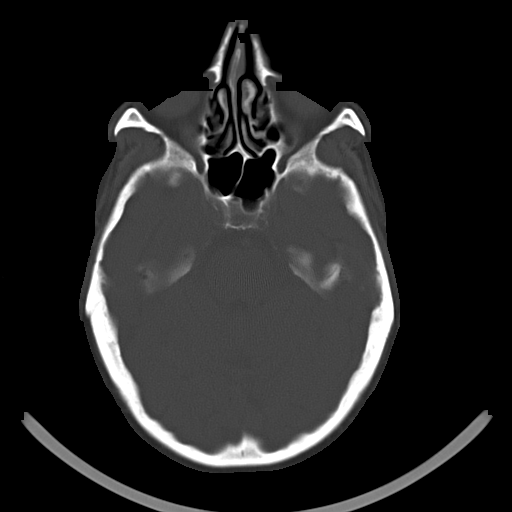

[16 of 30 positions shown; findings below may reference images not displayed]

FINDINGS: No intracranial hemorrhage.

Small vessel disease type changes without CT evidence of large acute
infarct.

Mild atrophy without hydrocephalus.

No intracranial mass lesion noted on this unenhanced exam.

Vascular calcifications.
IMPRESSION: No intracranial hemorrhage.

Small vessel disease type changes without CT evidence of large acute
infarct.

Mild atrophy without hydrocephalus.

## 2014-03-14 DIAGNOSIS — H9190 Unspecified hearing loss, unspecified ear: Secondary | ICD-10-CM | POA: Diagnosis not present

## 2014-03-15 DIAGNOSIS — H4011X0 Primary open-angle glaucoma, stage unspecified: Secondary | ICD-10-CM | POA: Diagnosis not present

## 2014-03-15 DIAGNOSIS — H409 Unspecified glaucoma: Secondary | ICD-10-CM | POA: Diagnosis not present

## 2014-03-16 ENCOUNTER — Encounter: Payer: Self-pay | Admitting: Cardiovascular Disease

## 2014-03-16 DIAGNOSIS — M069 Rheumatoid arthritis, unspecified: Secondary | ICD-10-CM | POA: Diagnosis not present

## 2014-04-21 DIAGNOSIS — I10 Essential (primary) hypertension: Secondary | ICD-10-CM | POA: Diagnosis not present

## 2014-04-21 DIAGNOSIS — I2510 Atherosclerotic heart disease of native coronary artery without angina pectoris: Secondary | ICD-10-CM | POA: Diagnosis not present

## 2014-04-21 DIAGNOSIS — E780 Pure hypercholesterolemia: Secondary | ICD-10-CM | POA: Diagnosis not present

## 2014-04-21 DIAGNOSIS — M069 Rheumatoid arthritis, unspecified: Secondary | ICD-10-CM | POA: Diagnosis not present

## 2014-04-21 DIAGNOSIS — Z23 Encounter for immunization: Secondary | ICD-10-CM | POA: Diagnosis not present

## 2014-04-27 ENCOUNTER — Ambulatory Visit (INDEPENDENT_AMBULATORY_CARE_PROVIDER_SITE_OTHER): Payer: Medicare Other | Admitting: Neurology

## 2014-04-27 ENCOUNTER — Encounter: Payer: Self-pay | Admitting: Neurology

## 2014-04-27 VITALS — BP 133/78 | HR 75 | Ht 62.0 in | Wt 134.0 lb

## 2014-04-27 DIAGNOSIS — G20 Parkinson's disease: Secondary | ICD-10-CM | POA: Diagnosis not present

## 2014-04-27 NOTE — Patient Instructions (Signed)
I think your Parkinson's disease has remained fairly stable, which is reassuring. Nevertheless, as you know, this disease does progress with time. It can affect your balance, your memory, your mood, your bowel and bladder function, your posture, balance and walking. Overall you are doing fairly well but I do want to suggest a few things today:  Remember to drink plenty of fluid, eat healthy meals and do not skip any meals. Try to eat protein with a every meal and eat a healthy snack such as fruit or nuts in between meals. Try to keep a regular sleep-wake schedule and try to exercise daily, particularly in the form of walking, 20-30 minutes a day, if you can.   Taking your medication on schedule is key.   Try to stay active physically and mentally. Engage in social activities in your community and with your family and try to keep up with current events by reading the newspaper or watching the news. Try to do word puzzles and you may like to do word puzzles and brain games on the computer such as on [email or web address].[email or web address]/.   As far as your medications are concerned, I would like to suggest that you take your current medication with the following additional changes: no change.      As far as diagnostic testing, I will order: no new test needed.   I would like to see you back in 6 months, sooner if we need to. Please call us with any interim questions, concerns, problems, updates or refill requests.  Our phone number is [phone]. We also have an after hours call service for urgent matters and there is a physician on-call for urgent questions, that cannot wait till the next work day. For any emergencies you know to call 911 or go to the nearest emergency room.

## 2014-04-27 NOTE — Progress Notes (Signed)
Subjective:    Patient ID: [id number] BUTRUM is a 80 y.o. female.  HPI    Interim history:   Ms. Spaziani is a very pleasant 80 year old right-handed woman with an underlying medical history of hyperlipidemia, hypertension, colonic polyps, heart disease, diverticulitis, osteopenia and vitamin D deficiency who presents for followup consultation of her parkinsonism. She is unaccompanied today. I last saw her on 11/30/2013, at which time she reported difficulty with her right hand with increase in tremor and worsening handwriting. She felt stable with regards to her mood and her memory. She had not been driving at night. 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 I felt she had progressed mildly. I suggested a trial of Sinemet low dose. I asked her to start with 25-100 mg strength half a pill twice daily with slow titration to up to one pill 3 times a day. She called in June and reported issues with constipation and was advised to reduce Sinemet to half a pill 3 times a day.  Today, she reports doing well with C/L 1/2 pill tid and feels her tremors are better. She goes to her gym daily for about 30 minutes to ride the exercise bike. She is trying to do better with her nutrition. She is eating 3 meals and trying to get some snacks in as well. She is trying to stay better hydrated. Constipation improved after she lowered her Sinemet. She thinks she is doing well on it.  I saw her on 06/01/2013, at which time I felt her exam was consistent with mild parkinsonism, possibly right ear Parkinson's disease. I felt she was stable. She reported a fainting spell and I felt she had a vasovagal syncope. She reported a tendency not to drink enough fluid and often skipping her lunch. I advised her strongly to keep well hydrated and not to skip any meals. I asked her to exercise regularly. I did not suggest any new medications especially in light of her recent  syncopal spell and possible side effects from parkinsonian medication. I asked her to drive only locally and then 35 miles zones, during the daytime and avoid interstates.  I first met her on 02/11/2013, and which time I suggested watchful waiting as far as her parkinsonian symptoms and started no new medications. I did suggest a brain MRI. She had a brain MRI without contrast on 03/13/2013 and I reviewed the results: Abnormal MRI scan of the brain showing mild changes of chronic microvascular ischemia and generalized cerebral atrophy. We called her with the results.  In the interim, on 05/02/2013 she presented to the emergency room after a brief episode of syncope while at church. She felt lightheaded and had a brief passing out spell but someone caught her and was told she was out for about 5 seconds. She did not injure herself. She indicated having had a good breakfast, but not drinking enough fluid. She did have a plain head CT on 05/02/2013: No intracranial hemorrhage. Small vessel disease type changes without CT evidence of large acute infarct. Mild atrophy without hydrocephalus. It was felt that she most likely had a vasovagal syncope. It was suggested that she stay overnight for further observation and workup she declined. She was not found to be orthostatic.  She had noted a right arm tremor and change in her handwriting. She denied recurrent headaches changes with her speech or swallowing, changes in her gait or any other focal neurologic problems. She has had some  loss of appetite and has lost some weight. There is no family history of Parkinson's disease or tremors and there is no personal history of neuroleptic medication use, no pesticide or toxin exposures.  Her family had noted the tremor. She fell in 2014 while walking the dog, and fell on the road, no Fx, no head injury, no LOC. She has not noted any memory loss, mood d/o, hallucinations, or recurrent HAs. She has been driving, not at night and  tries to avoid the interstate. She lives in an apartment and does her own groceries, cleaning, cooking. She lives alone and no longer keeps her GD's dog.     Her Past Medical History Is Significant For: Past Medical History  Diagnosis Date  . Hypertension   . Hyperlipidemia   . Chest pain   . GERD (gastroesophageal reflux disease)   . Tremor     Tremor in right arm, Pt not sure why  . Parkinsonism 06/01/2013    Her Past Surgical History Is Significant For: Past Surgical History  Procedure Laterality Date  . Appendectomy    . Tonsillectomy    . Cardiovascular stress test  05/29/2005    EF 80%    Her Family History Is Significant For: Family History  Problem Relation Age of Onset  . Heart attack Mother   . Heart attack Father   . Heart attack Brother     Her Social History Is Significant For: History   Social History  . Marital Status: Widowed    Spouse Name: N/A    Number of Children: N/A  . Years of Education: N/A   Social History Main Topics  . Smoking status: Never Smoker   . Smokeless tobacco: None  . Alcohol Use: No  . Drug Use: No  . Sexual Activity: None   Other Topics Concern  . None   Social History Narrative  . None    Her Allergies Are:  Allergies  Allergen Reactions  . Flagyl [Metronidazole Hcl]   . Sulfur   :   Her Current Medications Are:  Outpatient Encounter Prescriptions as of 04/27/2014  Medication Sig  . alendronate (FOSAMAX) 70 MG tablet Take 70 mg by mouth every 7 (seven) days. Take with a full glass of water on an empty stomach.   Marland Kitchen aspirin 81 MG tablet Take 81 mg by mouth daily.  Marland Kitchen atorvastatin (LIPITOR) 20 MG tablet TAKE ONE TABLET BY MOUTH ONCE DAILY  . Calcium Carb-Cholecalciferol (CALCIUM 600+D3 PO) Take by mouth 2 (two) times daily before a meal.    . carbidopa-levodopa (SINEMET IR) 25-100 MG per tablet Take 0.5 tablets by mouth 3 (three) times daily.  . Cholecalciferol (VITAMIN D PO) Take 1,000 mg by mouth daily.    .  folic acid (FOLVITE) 1 MG tablet Take 1 mg by mouth daily.  Marland Kitchen losartan-hydrochlorothiazide (HYZAAR) 100-12.5 MG per tablet Take 1 tablet by mouth daily.    . methotrexate (RHEUMATREX) 2.5 MG tablet Take 7.5 mg by mouth once a week. Every Thursday Caution:Chemotherapy. Protect from light.  . Multiple Vitamin (MULTIVITAMIN) tablet Take 1 tablet by mouth daily.    Marland Kitchen PREVIDENT 5000 BOOSTER PLUS 1.1 % PSTE Place 1 application onto teeth daily.  . TRAVATAN Z 0.004 % SOLN ophthalmic solution Place 1 drop into both eyes at bedtime.  :  Review of Systems:  Out of a complete 14 point review of systems, all are reviewed and negative with the exception of these symptoms as listed below:  Review  of Systems  All other systems reviewed and are negative.   Objective:  Neurologic Exam  Physical Exam Physical Examination:   Filed Vitals:   04/27/14 1152  BP: 133/78  Pulse: 75   General Examination: The patient is a very pleasant 80 y.o. female in no acute distress. She is able to provide history very well. She is in good spirits. She is well-groomed. She is situated comfortably in the chair. She has gained a few pounds.  HEENT: Normocephalic, atraumatic, pupils are equal, round and reactive to light and accommodation. Funduscopic exam is normal with sharp disc margins noted. Extraocular tracking shows mild saccadic breakdown without nystagmus noted. There is limitation to upper gaze. There is no decrease in eye blink rate. Hearing is impaired mildly; she has bilateral hearing aids.  Face is symmetric with minimal facial masking and normal facial sensation. There is no lip, neck or jaw tremor. Neck is moderately rigid with intact passive ROM. There are no carotid bruits on auscultation. Oropharynx exam reveals mild mouth dryness. No significant airway crowding is noted. Mallampati is class II. Tongue protrudes centrally and palate elevates symmetrically. There is no drooling.   Chest: is clear to  auscultation without wheezing, rhonchi or crackles noted.  Heart: sounds are regular and normal without murmurs, rubs or gallops noted.   Abdomen: is soft, non-tender and non-distended with normal bowel sounds appreciated on auscultation.  Extremities: There is no pitting edema in the distal lower extremities bilaterally. Pedal pulses are intact. There are no varicose veins.  Skin: is warm and dry with no trophic changes noted. Age-related changes are noted on the skin.   Musculoskeletal: exam reveals no obvious joint deformities other than arthritic changes to her hands, no tenderness, joint swelling or erythema.  Neurologically:  Mental status: The patient is awake and alert, paying good attention. She is able to completely provide the history. She is oriented to: person, place, time/date, situation, day of week, month of year and year. Her memory, attention, language and knowledge are intact. There is no aphasia, agnosia, apraxia or anomia. There is a no bradyphrenia. Speech is mildly hypophonic with no dysarthria noted. Mood is congruent and affect is normal.    Cranial nerves are as described above under HEENT exam. In addition, shoulder shrug is normal with equal shoulder height noted.  Motor exam: Normal bulk, and strength for age is noted. There are no dyskinesias noted. Tone is minimally rigid on the RUE with presence of cogwheeling in the right upper extremity. There is overall minimal bradykinesia. There is no drift or rebound.  There is a very mild resting tremor in the right upper extremity and no other resting tremor. The tremor is intermittent today. There is a minimal postural component, no action tremor. Romberg is negative.  Reflexes are 2+ in the upper extremities and 1+ in the lower extremities.     Fine motor skills exam: Finger taps are mild to moderately impaired on the right and mildly impaired on the left. Hand movements are mildly impaired on the right and not impaired  on the left. RAP (rapid alternating patting) is mildly impaired on the right and minimally impaired on the left. Foot taps are mild to moderately impaired on the right and mildly impaired on the left. Foot agility (in the form of heel stomping) is moderately impaired on the right and mildly impaired on the left.    Cerebellar testing shows no dysmetria or intention tremor on finger to nose testing. Heel to  shin is unremarkable bilaterally. There is no truncal or gait ataxia.   Sensory exam is intact to light touch, pinprick, vibration, temperature sense in the upper and lower extremities.   Gait, station and balance: She stands up from the seated position with very mild difficulty and does not need to push up with Her hands. She needs no assistance. No veering to one side is noted. She is not noted to lean to the side. Posture is mildly stooped, possibly age-appropriate. Stance is narrow-based. She walks with decrease in stride length and pace and decreased arm swing on the right. She turns in 3 steps. Tandem walk is not possible. Balance is mildly impaired. She does not report any lightheadedness upon standing.   Assessment and Plan:   In summary, KAILEENA OBI is a very pleasant 80 year old female with a history and exam consistent with mild parkinsonism, possibly R sided Parkinson's disease. She has been able to tolerate low-dose levodopa therapy on half a pill 3 times a day. On the full pill 3 times a day she had significant constipation. She feels improved with the levodopa. She has had slight improvement in her clinical picture as well. I think it is worthwhile continuing with Sinemet at half a pill 3 times a day. She is commended on trying to maintain a healthy lifestyle and be active.  She has been able to go to the gym almost every day. She drives locally, not at night and [place] She has one daughter who checks on her as well as her son-in-law and her grandchildren. She has  3 grandchildren, one is out of state. As she has done well, I suggested a six-month checkup, sooner if the need arises. I encouraged her to call with any interim questions, concerns, problems or updates. She was in agreement.

## 2014-05-12 ENCOUNTER — Encounter: Payer: Self-pay | Admitting: Cardiovascular Disease

## 2014-05-31 DIAGNOSIS — H6120 Impacted cerumen, unspecified ear: Secondary | ICD-10-CM | POA: Diagnosis not present

## 2014-06-20 DIAGNOSIS — M0589 Other rheumatoid arthritis with rheumatoid factor of multiple sites: Secondary | ICD-10-CM | POA: Diagnosis not present

## 2014-06-29 DIAGNOSIS — H5340 Unspecified visual field defects: Secondary | ICD-10-CM | POA: Diagnosis not present

## 2014-06-29 DIAGNOSIS — H4011X2 Primary open-angle glaucoma, moderate stage: Secondary | ICD-10-CM | POA: Diagnosis not present

## 2014-07-28 ENCOUNTER — Telehealth: Payer: Self-pay | Admitting: Neurology

## 2014-07-28 MED ORDER — CARBIDOPA-LEVODOPA 25-100 MG PO TABS: 0.5000 | ORAL_TABLET | Freq: Three times a day (TID) | ORAL | Status: DC

## 2014-07-28 NOTE — Telephone Encounter (Signed)
Rx has been sent.  I called back, got no answer.  Left message. 

## 2014-07-28 NOTE — Telephone Encounter (Signed)
Patient requesting Rx refill for carbidopa-levodopa (SINEMET IR) 25-100 MG per tablet. Patient compeletley out of medication.  Please call and advise.

## 2014-08-10 ENCOUNTER — Other Ambulatory Visit: Payer: Self-pay | Admitting: Cardiovascular Disease

## 2014-09-19 DIAGNOSIS — M0579 Rheumatoid arthritis with rheumatoid factor of multiple sites without organ or systems involvement: Secondary | ICD-10-CM | POA: Diagnosis not present

## 2014-09-19 DIAGNOSIS — M0589 Other rheumatoid arthritis with rheumatoid factor of multiple sites: Secondary | ICD-10-CM | POA: Diagnosis not present

## 2014-09-19 DIAGNOSIS — Z79899 Other long term (current) drug therapy: Secondary | ICD-10-CM | POA: Diagnosis not present

## 2014-09-19 DIAGNOSIS — M810 Age-related osteoporosis without current pathological fracture: Secondary | ICD-10-CM | POA: Diagnosis not present

## 2014-09-19 DIAGNOSIS — R251 Tremor, unspecified: Secondary | ICD-10-CM | POA: Diagnosis not present

## 2014-10-25 DIAGNOSIS — I10 Essential (primary) hypertension: Secondary | ICD-10-CM | POA: Diagnosis not present

## 2014-10-25 DIAGNOSIS — Z0000 Encounter for general adult medical examination without abnormal findings: Secondary | ICD-10-CM | POA: Diagnosis not present

## 2014-10-25 DIAGNOSIS — H9190 Unspecified hearing loss, unspecified ear: Secondary | ICD-10-CM | POA: Diagnosis not present

## 2014-10-25 DIAGNOSIS — G20 Parkinson's disease: Secondary | ICD-10-CM | POA: Diagnosis not present

## 2014-10-25 DIAGNOSIS — E780 Pure hypercholesterolemia: Secondary | ICD-10-CM | POA: Diagnosis not present

## 2014-10-25 DIAGNOSIS — Z1389 Encounter for screening for other disorder: Secondary | ICD-10-CM | POA: Diagnosis not present

## 2014-10-25 DIAGNOSIS — E559 Vitamin D deficiency, unspecified: Secondary | ICD-10-CM | POA: Diagnosis not present

## 2014-10-25 DIAGNOSIS — K219 Gastro-esophageal reflux disease without esophagitis: Secondary | ICD-10-CM | POA: Diagnosis not present

## 2014-10-25 DIAGNOSIS — I2510 Atherosclerotic heart disease of native coronary artery without angina pectoris: Secondary | ICD-10-CM | POA: Diagnosis not present

## 2014-10-26 DIAGNOSIS — H33303 Unspecified retinal break, bilateral: Secondary | ICD-10-CM | POA: Diagnosis not present

## 2014-10-26 DIAGNOSIS — H47093 Other disorders of optic nerve, not elsewhere classified, bilateral: Secondary | ICD-10-CM | POA: Diagnosis not present

## 2014-10-26 DIAGNOSIS — H4011X2 Primary open-angle glaucoma, moderate stage: Secondary | ICD-10-CM | POA: Diagnosis not present

## 2014-10-26 DIAGNOSIS — H4423 Degenerative myopia, bilateral: Secondary | ICD-10-CM | POA: Diagnosis not present

## 2014-10-27 ENCOUNTER — Encounter: Payer: Self-pay | Admitting: Neurology

## 2014-10-27 ENCOUNTER — Ambulatory Visit (INDEPENDENT_AMBULATORY_CARE_PROVIDER_SITE_OTHER): Payer: Medicare Other | Admitting: Neurology

## 2014-10-27 VITALS — BP 143/73 | HR 67 | Resp 16 | Ht 62.0 in | Wt 142.0 lb

## 2014-10-27 DIAGNOSIS — G20 Parkinson's disease: Secondary | ICD-10-CM

## 2014-10-27 DIAGNOSIS — H9193 Unspecified hearing loss, bilateral: Secondary | ICD-10-CM | POA: Diagnosis not present

## 2014-10-27 DIAGNOSIS — M1990 Unspecified osteoarthritis, unspecified site: Secondary | ICD-10-CM | POA: Diagnosis not present

## 2014-10-27 DIAGNOSIS — H409 Unspecified glaucoma: Secondary | ICD-10-CM

## 2014-10-27 MED ORDER — CARBIDOPA-LEVODOPA 25-100 MG PO TABS: 0.5000 | ORAL_TABLET | Freq: Three times a day (TID) | ORAL | Status: DC

## 2014-10-27 NOTE — Progress Notes (Signed)
Subjective:    Patient ID: Shawna Doyle is a 81 y.o. female.  HPI     Interim history:   Shawna Doyle is a very pleasant 81 year old right-handed woman with an underlying medical history of hyperlipidemia, hypertension, colonic polyps, heart disease, diverticulitis, osteopenia and vitamin D deficiency who presents for followup consultation of her parkinsonism. She is unaccompanied today. I last saw her on 04/27/2014, at which time she reported doing well with carbidopa-levodopa half a pill 3 times a day and clinically she appeared better. I did not make any medication changes. She was encouraged to exercise and do better with her nutrition and hydration. She had no significant issues with constipation especially since she lowered her Sinemet to half a pill 3 times a day from a full pill 3 times a day previously. She was still driving.   Today, 10/27/2014: She drove herself to the appointment today. She lives in a ground floor apartment and her only daughter lives in town. She fell last month, while in [place] with her family, but thankfully did not hurt herself. She landed on her bottom. She was coming down some stairs in a restaurant and the stairs were poorly lit and she did not see the last step. She was able to still hold onto the railing. Otherwise, she has done well. She tries to exercise 5 days a week at the gym. She has been conscious of eating 3 meals a day which has helped her energy level she feels. She sleeps well. She takes a nap in the afternoon usually. 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 Next year she feels she would cut back some. She used to work as an Building control surveyor for preschool. She enjoys working with little children. Her daughter has 3 daughters and her granddaughters see her regularly as well. She tries to drink enough water. She had her regular checkup with her primary care physician earlier this week and yesterday she saw her  eye doctor for her glaucoma. Things are stable, she reports no new medications. She reports no sleep issues, no hallucinations, no motor memory issues.  Previously: I saw her on 11/30/2013, at which time she reported difficulty with her right hand with increase in tremor and worsening handwriting. She felt stable with regards to her mood and her memory. She had not been driving at night. 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 I felt she had progressed mildly. I suggested a trial of Sinemet low dose. I asked her to start with 25-100 mg strength half a pill twice daily with slow titration to up to one pill 3 times a day. She called in June and reported issues with constipation and was advised to reduce Sinemet to half a pill 3 times a day.  I saw her on 06/01/2013, at which time I felt her exam was consistent with mild parkinsonism, possibly right ear Parkinson's disease. I felt she was stable. She reported a fainting spell and I felt she had a vasovagal syncope. She reported a tendency not to drink enough fluid and often skipping her lunch. I advised her strongly to keep well hydrated and not to skip any meals. I asked her to exercise regularly. I did not suggest any new medications especially in light of her recent syncopal spell and possible side effects from parkinsonian medication. I asked her to drive only locally and then 35 miles zones, during the daytime and  avoid interstates.   I first met her on 02/11/2013, and which time I suggested watchful waiting as far as her parkinsonian symptoms and started no new medications. I did suggest a brain MRI. She had a brain MRI without contrast on 03/13/2013 and I reviewed the results: Abnormal MRI scan of the brain showing mild changes of chronic microvascular ischemia and generalized cerebral atrophy. We called her with the results.    In the interim, on 05/02/2013 she presented to the emergency room after  a brief episode of syncope while at church. She felt lightheaded and had a brief passing out spell but someone caught her and was told she was out for about 5 seconds. She did not injure herself. She indicated having had a good breakfast, but not drinking enough fluid. She did have a plain head CT on 05/02/2013: No intracranial hemorrhage. Small vessel disease type changes without CT evidence of large acute infarct. Mild atrophy without hydrocephalus. It was felt that she most likely had a vasovagal syncope. It was suggested that she stay overnight for further observation and workup she declined. She was not found to be orthostatic.   She had noted a right arm tremor and change in her handwriting. She denied recurrent headaches changes with her speech or swallowing, changes in her gait or any other focal neurologic problems. She has had some loss of appetite and has lost some weight. There is no family history of Parkinson's disease or tremors and there is no personal history of neuroleptic medication use, no pesticide or toxin exposures.   Her family had noted the tremor. She fell in 2014 while walking the dog, and fell on the road, no Fx, no head injury, no LOC. She has not noted any memory loss, mood d/o, hallucinations, or recurrent HAs. She has been driving, not at night and tries to avoid the interstate. She lives in an apartment and does her own groceries, cleaning, cooking. She lives alone and no longer keeps her GD's dog.     Her Past Medical History Is Significant For: Past Medical History  Diagnosis Date  . Hypertension   . Hyperlipidemia   . Chest pain   . GERD (gastroesophageal reflux disease)   . Tremor     Tremor in right arm, Pt not sure why  . Parkinsonism 06/01/2013    Her Past Surgical History Is Significant For: Past Surgical History  Procedure Laterality Date  . Appendectomy    . Tonsillectomy    . Cardiovascular stress test  05/29/2005    EF 80%    Her Family History Is  Significant For: Family History  Problem Relation Age of Onset  . Heart attack Mother   . Heart attack Father   . Heart attack Brother     Her Social History Is Significant For: History   Social History  . Marital Status: Widowed    Spouse Name: N/A  . Number of Children: N/A  . Years of Education: N/A   Social History Main Topics  . Smoking status: Never Smoker   . Smokeless tobacco: Not on file  . Alcohol Use: No  . Drug Use: No  . Sexual Activity: Not on file   Other Topics Concern  . None   Social History Narrative    Her Allergies Are:  Allergies  Allergen Reactions  . Flagyl [Metronidazole Hcl]   . Sulfur   :   Her Current Medications Are:  Outpatient Encounter Prescriptions as of 10/27/2014  Medication Sig  .  alendronate (FOSAMAX) 70 MG tablet Take 70 mg by mouth every 7 (seven) days. Take with a full glass of water on an empty stomach.   Marland Kitchen aspirin 81 MG tablet Take 81 mg by mouth daily.  Marland Kitchen atorvastatin (LIPITOR) 20 MG tablet TAKE ONE TABLET BY MOUTH ONCE DAILY  . Calcium Carb-Cholecalciferol (CALCIUM 600+D3 PO) Take by mouth 2 (two) times daily before a meal.    . carbidopa-levodopa (SINEMET IR) 25-100 MG per tablet Take 0.5 tablets by mouth 3 (three) times daily.  . Cholecalciferol (VITAMIN D PO) Take 1,000 mg by mouth daily.    . folic acid (FOLVITE) 1 MG tablet Take 1 mg by mouth daily.  Marland Kitchen losartan-hydrochlorothiazide (HYZAAR) 100-12.5 MG per tablet Take 1 tablet by mouth daily.    . methotrexate (RHEUMATREX) 2.5 MG tablet Take 7.5 mg by mouth once a week. Every Thursday Caution:Chemotherapy. Protect from light.  . Multiple Vitamin (MULTIVITAMIN) tablet Take 1 tablet by mouth daily.    Marland Kitchen PREVIDENT 5000 BOOSTER PLUS 1.1 % PSTE Place 1 application onto teeth daily.  . TRAVATAN Z 0.004 % SOLN ophthalmic solution Place 1 drop into both eyes at bedtime.  :  Review of Systems:  Out of a complete 14 point review of systems, all are reviewed and negative with  the exception of these symptoms as listed below:   Review of Systems  All other systems reviewed and are negative.   Objective:  Neurologic Exam  Physical Exam Physical Examination:   Filed Vitals:   10/27/14 0922  BP: 143/73  Pulse: 67  Resp: 16   General Examination: The patient is a very pleasant 81 y.o. female in no acute distress. She is able to provide history very well. She is in good spirits. She is well-groomed. She is situated comfortably in the chair. She has gained a few pounds.  HEENT: Normocephalic, atraumatic, pupils are equal, round and reactive to light and accommodation. Funduscopic exam is normal with sharp disc margins noted. Extraocular tracking shows mild saccadic breakdown without nystagmus noted. She had bilateral cataract repairs. There is limitation to upper gaze. There is no decrease in eye blink rate. Hearing is impaired mildly; she has bilateral hearing aids.  Face is symmetric with minimal facial masking and normal facial sensation. There is no lip, neck or jaw tremor. Neck is moderately rigid with intact passive ROM. There are no carotid bruits on auscultation. Oropharynx exam reveals mild mouth dryness. No significant airway crowding is noted. Mallampati is class II. Tongue protrudes centrally and palate elevates symmetrically. There is no actual drooling, but a little bit of excess moisture around the corners of her mouth.   Chest: is clear to auscultation without wheezing, rhonchi or crackles noted.  Heart: sounds are regular and normal without murmurs, rubs or gallops noted.   Abdomen: is soft, non-tender and non-distended with normal bowel sounds appreciated on auscultation.  Extremities: There is no pitting edema in the distal lower extremities bilaterally. Pedal pulses are intact. There are no varicose veins.  Skin: is warm and dry with no trophic changes noted. Age-related changes are noted on the skin.   Musculoskeletal: exam reveals no obvious  joint deformities other than arthritic changes to her hands, no tenderness, joint swelling or erythema.  Neurologically:  Mental status: The patient is awake and alert, paying good attention. She is able to completely provide the history. She is oriented to: person, place, time/date, situation, day of week, month of year and year. Her memory, attention, language  and knowledge are intact. There is no aphasia, agnosia, apraxia or anomia. There is a no bradyphrenia. Speech is mildly hypophonic with no dysarthria noted. Mood is congruent and affect is normal.    Cranial nerves are as described above under HEENT exam. In addition, shoulder shrug is normal with equal shoulder height noted.  Motor exam: Normal bulk, and strength for age is noted. There are no dyskinesias noted. Tone is minimally rigid on the RUE with presence of cogwheeling in the right upper extremity. There is overall minimal bradykinesia. There is no drift or rebound.  There is a mild resting tremor in the right upper extremity and no other resting tremor. The tremor is intermittent today. There is a minimal postural component, no action tremor. Romberg is negative.  Reflexes are 2+ in the upper extremities and 1+ in the lower extremities.     Fine motor skills exam: Finger taps are mild to moderately impaired on the right and mildly impaired on the left. Hand movements are mildly impaired on the right and not impaired on the left. RAP (rapid alternating patting) is mildly impaired on the right and minimally impaired on the left. Foot taps are mild to moderately impaired on the right and mildly impaired on the left. Foot agility (in the form of heel stomping) is moderately impaired on the right and mildly impaired on the left.    Cerebellar testing shows no dysmetria or intention tremor on finger to nose testing. Heel to shin is unremarkable bilaterally. There is no truncal or gait ataxia.   Sensory exam is intact to light touch,  pinprick, vibration, temperature sense in the upper and lower extremities.   Gait, station and balance: She stands up from the seated position with very mild difficulty and does not need to push up with Her hands. She needs no assistance. No veering to one side is noted. She is not noted to lean to the side. Posture is mildly stooped, possibly age-appropriate. Stance is narrow-based. She walks with decrease in stride length and pace and decreased arm swing on the right. She turns in 3 steps. Tandem walk is not possible. Balance is mildly impaired. She does not report any lightheadedness upon standing.   Assessment and Plan:   In summary, AUDREANA HANCOX is a very pleasant 81 year old female with an underlying medical history of hyperlipidemia, hypertension, colonic polyps, heart disease, diverticulitis, osteopenia and vitamin D deficiency who  has mild parkinsonism, possibly R sided Parkinson's disease. She has been able to tolerate low-dose levodopa therapy, half a pill 3 times a day. On the full pill 3 times a day she had significant constipation. She feels improved with the levodopa. She has had slight improvement in her clinical picture as well. I think it is worthwhile continuing with Sinemet at half a pill 3 times a day. She is commended on trying to maintain a healthy lifestyle and be active.  She has been able to go to the gym almost every day. She drives locally, not at night and not on the [place]. She has one daughter who checks on her and 3 granddaughters, one is out of state. As she has done well, I suggested a six-month checkup, sooner if the need arises. I encouraged her to call with any interim questions, concerns, problems or updates. She was in agreement. Glaucoma is stable per her report and she had her eye exam yesterday. She had her routine health exam with her primary care physician this week as well.  I spent 25 minutes in total face-to-face time with the patient, more than 50%  of which was spent in counseling and coordination of care, reviewing test results, reviewing medication and discussing or reviewing the diagnosis of PD, its prognosis and treatment options.

## 2014-10-27 NOTE — Patient Instructions (Signed)
I think your Parkinson's disease has remained fairly stable, which is reassuring. Nevertheless, as you know, this disease does progress with time. It can affect your balance, your memory, your mood, your bowel and bladder function, your posture, balance and walking. Overall you are doing fairly well but I do want to suggest a few things today:   Remember to drink plenty of fluid, eat healthy meals and do not skip any meals. Try to eat protein with a every meal and eat a healthy snack such as fruit or nuts in between meals. Try to keep a regular sleep-wake schedule and try to exercise daily, particularly in the form of walking, 20-30 minutes a day, if you can.   Taking your medication on schedule is key.   Try to stay active physically and mentally. Engage in social activities in your community and with your family and try to keep up with current events by reading the newspaper or watching the news. Try to do word puzzles and you may like to do word puzzles and brain games on the computer such as on [email or web address].   As far as your medications are concerned, I would like to suggest that you take your current medication with the following additional changes: no changes.     I would like to see you back in 6 months, sooner if we need to. Please call us with any interim questions, concerns, problems, updates or refill requests.  Our phone number is [phone]. We also have an after hours call service for urgent matters and there is a physician on-call for urgent questions, that cannot wait till the next work day. For any emergencies you know to call 911 or go to the nearest emergency room.      

## 2014-12-16 DIAGNOSIS — R251 Tremor, unspecified: Secondary | ICD-10-CM | POA: Diagnosis not present

## 2014-12-16 DIAGNOSIS — M810 Age-related osteoporosis without current pathological fracture: Secondary | ICD-10-CM | POA: Diagnosis not present

## 2014-12-16 DIAGNOSIS — M0589 Other rheumatoid arthritis with rheumatoid factor of multiple sites: Secondary | ICD-10-CM | POA: Diagnosis not present

## 2014-12-16 DIAGNOSIS — Z79899 Other long term (current) drug therapy: Secondary | ICD-10-CM | POA: Diagnosis not present

## 2015-03-01 DIAGNOSIS — H4011X2 Primary open-angle glaucoma, moderate stage: Secondary | ICD-10-CM | POA: Diagnosis not present

## 2015-03-08 ENCOUNTER — Other Ambulatory Visit: Payer: Self-pay | Admitting: Cardiovascular Disease

## 2015-03-16 DIAGNOSIS — Z79899 Other long term (current) drug therapy: Secondary | ICD-10-CM | POA: Diagnosis not present

## 2015-03-28 ENCOUNTER — Ambulatory Visit (INDEPENDENT_AMBULATORY_CARE_PROVIDER_SITE_OTHER): Payer: Medicare Other | Admitting: Cardiovascular Disease

## 2015-03-28 ENCOUNTER — Encounter: Payer: Self-pay | Admitting: Cardiovascular Disease

## 2015-03-28 VITALS — BP 140/80 | HR 70 | Ht 62.0 in | Wt 145.6 lb

## 2015-03-28 DIAGNOSIS — I10 Essential (primary) hypertension: Secondary | ICD-10-CM | POA: Diagnosis not present

## 2015-03-28 DIAGNOSIS — E785 Hyperlipidemia, unspecified: Secondary | ICD-10-CM

## 2015-03-28 LAB — BASIC METABOLIC PANEL
BUN: 19 mg/dL (ref 6–23)
CHLORIDE: 105 meq/L (ref 96–112)
CO2: 29 meq/L (ref 19–32)
CREATININE: 0.94 mg/dL (ref 0.40–1.20)
Calcium: 9.3 mg/dL (ref 8.4–10.5)
GFR: 60.26 mL/min (ref >60.00)
Glucose, Bld: 91 mg/dL (ref 70–99)
Potassium: 3.6 mEq/L (ref 3.5–5.1)
Sodium: 142 mEq/L (ref 135–145)

## 2015-03-28 LAB — LIPID PANEL
Cholesterol: 130 mg/dL (ref 0–200)
HDL: 62.6 mg/dL (ref >39.00)
LDL Cholesterol: 53 mg/dL (ref 0–99)
NONHDL: 67.43
TRIGLYCERIDES: 72 mg/dL (ref 0.0–149.0)
Total CHOL/HDL Ratio: 2
VLDL: 14.4 mg/dL (ref 0.0–40.0)

## 2015-03-28 LAB — HEPATIC FUNCTION PANEL
ALK PHOS: 65 U/L (ref 39–117)
ALT: 19 U/L (ref 0–35)
AST: 19 U/L (ref 0–37)
Albumin: 4 g/dL (ref 3.5–5.2)
BILIRUBIN DIRECT: 0.1 mg/dL (ref 0.0–0.3)
TOTAL PROTEIN: 7 g/dL (ref 6.0–8.3)
Total Bilirubin: 0.5 mg/dL (ref 0.2–1.2)

## 2015-03-28 NOTE — Patient Instructions (Addendum)

## 2015-03-28 NOTE — Progress Notes (Signed)
Shawna Doyle Date of Birth  October 30, 1928 [facility]     [place]  [address]    [address]   [address] [place], [place]  [address]    [place], [place]  [address] [phone]  Fax  [phone]  [phone]  Fax [phone]  Problems 1. Hypertension 2. Hyperlipidemia   History of Present Illness:  Shawna Doyle is an 81 yo with a hx of hypertension and hyperlipidemia.  She has done well since I last saw her.  She denies any chest pain or dyspnea.   She has been exercising regularly - she goes to the gym.  She has been having difficulty with her right knee and his been getting injections.  She deines any chest pain or dyspnea.  January 15, 2013:  Shawna Doyle is doing well.  She is able to do all of her normal activities without problems.  She did have some weakness after doing her laundry.  She felt better after eating and drinking and taking a rest.   No CP , no dyspnea.    Dec. 16, 2014:  She had a syncopal episode while at church in Oct.  We to the ER .  They thought it was volume depletion.  She knows that she does not drink enough.  No further episodes.  , no CP , no dyspnea.   January 18, 2014:  Shawna Doyle is doing well.  Still exercising regularly.   No further syncopal episodes.  She is eating and drinking better.   She has been diagnosed with Parkinsons - has a tremor in her right hand primarily.  03/28/2015: No CP , no dyspnea, doing well. Exercising some but not recently.   Current Outpatient Prescriptions on File Prior to Visit  Medication Sig Dispense Refill  . alendronate (FOSAMAX) 70 MG tablet Take 70 mg by mouth every 7 (seven) days. Take with a full glass of water on an empty stomach.     Marland Kitchen aspirin 81 MG tablet Take 81 mg by mouth daily.    Marland Kitchen atorvastatin (LIPITOR) 20 MG tablet TAKE ONE TABLET BY MOUTH ONCE DAILY 30 tablet 5  . Calcium Carb-Cholecalciferol (CALCIUM 600+D3 PO) Take 600 mg by mouth 2 (two) times daily before a meal.      . carbidopa-levodopa (SINEMET IR) 25-100 MG per tablet Take 0.5 tablets by mouth 3 (three) times daily. 135 tablet 3  . Cholecalciferol (VITAMIN D PO) Take 1,000 mg by mouth daily.      . folic acid (FOLVITE) 1 MG tablet Take 1 mg by mouth daily.    Marland Kitchen losartan-hydrochlorothiazide (HYZAAR) 100-12.5 MG per tablet Take 1 tablet by mouth daily.      . methotrexate (RHEUMATREX) 2.5 MG tablet Take 7.5 mg by mouth once a week. Every Thursday Caution:Chemotherapy. Protect from light.    . Multiple Vitamin (MULTIVITAMIN) tablet Take 1 tablet by mouth daily.      Marland Kitchen PREVIDENT 5000 BOOSTER PLUS 1.1 % PSTE Place 1 application onto teeth daily.    . TRAVATAN Z 0.004 % SOLN ophthalmic solution Place 1 drop into both eyes at bedtime.     No current facility-administered medications on file prior to visit.    Allergies  Allergen Reactions  . Flagyl [Metronidazole Hcl]   . Sulfur     Past Medical History  Diagnosis Date  . Hypertension   . Hyperlipidemia   . Chest pain   . GERD (gastroesophageal reflux disease)   . Tremor  Tremor in right arm, Pt not sure why  . Parkinsonism 06/01/2013    Past Surgical History  Procedure Laterality Date  . Appendectomy    . Tonsillectomy    . Cardiovascular stress test  05/29/2005    EF 80%    History  Smoking status  . Never Smoker   Smokeless tobacco  . Not on file    History  Alcohol Use No    Family History  Problem Relation Age of Onset  . Heart attack Mother   . Heart attack Father   . Heart attack Brother     Reviw of Systems:  Reviewed in the HPI.  All other systems are negative.  Physical Exam: BP 140/80 mmHg  Pulse 70  Ht 5\' 2"  (1.575 m)  Wt 66.044 kg (145 lb 9.6 oz)  BMI 26.62 kg/m2 The patient is alert and oriented x 3.  The mood and affect are normal.   Skin: warm and dry.  Color is normal.    HEENT:   Neck is supple, no JVD, normal carotids Lungs: clear  Heart: RR, S1 , S2   Abdomen: + BS, nontender., no  HSM Extremities:  No c/c/e Neuro:  Nonfocal    ECG: 03/28/2015: Normal sinus rhythm at 70. She has a first degree AV block.  Assessment / Plan:   1. Hypertension- her blood pressure is well-controlled. Continue current medications.  2. Hyperlipidemia- we'll check fasting labs today including basic medical profile, liver enzymes, and lipid profile.  I'll see her again in one year.  Gidget Quizhpi, 05/28/2015, MD  03/28/2015 10:06 AM    [facility] [address] [place],  [address] [place], [place]  [place] Pager [phone] Phone: [phone]; Fax: [phone]   [facility]  [address] [place], [place]  [place] [phone]   Fax [phone]

## 2015-03-30 NOTE — Addendum Note (Signed)
Addended by: Reesa Chew on: 03/30/2015 05:28 PM   Modules accepted: Orders, SmartSet

## 2015-04-06 ENCOUNTER — Telehealth: Payer: Self-pay

## 2015-04-06 NOTE — Telephone Encounter (Signed)
I spoke to patient about changing her appt time in October to 10:30 to allow for appt. Patient reported that she was able to do that.

## 2015-05-09 ENCOUNTER — Encounter: Payer: Self-pay | Admitting: Neurology

## 2015-05-09 ENCOUNTER — Ambulatory Visit (INDEPENDENT_AMBULATORY_CARE_PROVIDER_SITE_OTHER): Payer: Medicare Other | Admitting: Neurology

## 2015-05-09 VITALS — BP 136/70 | HR 78 | Resp 16 | Ht 62.0 in | Wt 144.0 lb

## 2015-05-09 DIAGNOSIS — I83891 Varicose veins of right lower extremities with other complications: Secondary | ICD-10-CM

## 2015-05-09 DIAGNOSIS — M7989 Other specified soft tissue disorders: Secondary | ICD-10-CM

## 2015-05-09 DIAGNOSIS — R600 Localized edema: Secondary | ICD-10-CM | POA: Diagnosis not present

## 2015-05-09 DIAGNOSIS — I2510 Atherosclerotic heart disease of native coronary artery without angina pectoris: Secondary | ICD-10-CM | POA: Diagnosis not present

## 2015-05-09 DIAGNOSIS — E7800 Pure hypercholesterolemia, unspecified: Secondary | ICD-10-CM | POA: Diagnosis not present

## 2015-05-09 DIAGNOSIS — G20 Parkinson's disease: Secondary | ICD-10-CM | POA: Diagnosis not present

## 2015-05-09 DIAGNOSIS — Z23 Encounter for immunization: Secondary | ICD-10-CM | POA: Diagnosis not present

## 2015-05-09 DIAGNOSIS — H6123 Impacted cerumen, bilateral: Secondary | ICD-10-CM | POA: Diagnosis not present

## 2015-05-09 DIAGNOSIS — I119 Hypertensive heart disease without heart failure: Secondary | ICD-10-CM | POA: Diagnosis not present

## 2015-05-09 MED ORDER — CARBIDOPA-LEVODOPA 25-100 MG PO TABS: 0.5000 | ORAL_TABLET | Freq: Three times a day (TID) | ORAL | Status: AC

## 2015-05-09 NOTE — Progress Notes (Signed)
Subjective:    Patient ID: Shawna Doyle is a 81 y.o. female.  HPI     Interim history:   Shawna Doyle is a very pleasant 81 year old right-handed woman with an underlying medical history of hyperlipidemia, hypertension, colonic polyps, heart disease, diverticulitis, osteopenia and vitamin D deficiency who presents for followup consultation of Shawna Doyle parkinsonism. She is unaccompanied today. I last saw Shawna Doyle on 10/27/2014, at which time she reported a fall a month prior while in [place] with Shawna Doyle family. Thankfully she did not hurt herself. She was going down some stairs at a restaurant with Shawna Doyle stairs were poorly lit. She did not see the last step. She was still trying to stay active and exercised 5 days a week at the gym. She was trying to eat regularly and drink enough water. 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 She was enjoying working with little children. I suggested she continue with low-dose Sinemet, half a pill 3 times a day.   Today, 05/09/2015: She reports doing well, just came back 3 days ago from a trip to [place] with Shawna Doyle daughter, Shawna Doyle  Granddaughter who is in high school , to visit Shawna Doyle other granddaughter who started college in [place]. They drove 2 [place] over 3 days and flew back. She was active. She did a lot of walking. She tries to drink enough water. She drives locally. She drove herself here today. Sometimes one of Shawna Doyle granddaughters takes Shawna Doyle. She has an appointment with Shawna Doyle primary care physician this afternoon. She feels that Shawna Doyle symptoms are stable and she would like to keep the same medication. She worries about developing constipation if we increase Shawna Doyle levodopa.  Previously:   I saw Shawna Doyle on 04/27/2014, at which time she reported doing well with carbidopa-levodopa half a pill 3 times a day and clinically she appeared better. I did not make any medication changes. She was encouraged to exercise and do better with Shawna Doyle nutrition and hydration. She had no  significant issues with constipation especially since she lowered Shawna Doyle Sinemet to half a pill 3 times a day from a full pill 3 times a day previously. She was still driving.  I saw Shawna Doyle on 11/30/2013, at which time she reported difficulty with Shawna Doyle right hand with increase in tremor and worsening handwriting. She felt stable with regards to Shawna Doyle mood and Shawna Doyle memory. She had not been driving at night. 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 I felt she had progressed mildly. I suggested a trial of Sinemet low dose. I asked Shawna Doyle to start with 25-100 mg strength half a pill twice daily with slow titration to up to one pill 3 times a day. She called in June and reported issues with constipation and was advised to reduce Sinemet to half a pill 3 times a day.  I saw Shawna Doyle on 06/01/2013, at which time I felt Shawna Doyle exam was consistent with mild parkinsonism, possibly right ear Parkinson's disease. I felt she was stable. She reported a fainting spell and I felt she had a vasovagal syncope. She reported a tendency not to drink enough fluid and often skipping Shawna Doyle lunch. I advised Shawna Doyle strongly to keep well hydrated and not to skip any meals. I asked Shawna Doyle to exercise regularly. I did not suggest any new medications especially in light of Shawna Doyle recent syncopal spell and possible side effects from parkinsonian medication. I asked Shawna Doyle to drive only locally and then 35 miles  zones, during the daytime and avoid interstates.   I first met Shawna Doyle on 02/11/2013, and which time I suggested watchful waiting as far as Shawna Doyle parkinsonian symptoms and started no new medications. I did suggest a brain MRI. She had a brain MRI without contrast on 03/13/2013 and I reviewed the results: Abnormal MRI scan of the brain showing mild changes of chronic microvascular ischemia and generalized cerebral atrophy. We called Shawna Doyle with the results.    In the interim, on 05/02/2013 she presented to the emergency  room after a brief episode of syncope while at church. She felt lightheaded and had a brief passing out spell but someone caught Shawna Doyle and was told she was out for about 5 seconds. She did not injure herself. She indicated having had a good breakfast, but not drinking enough fluid. She did have a plain head CT on 05/02/2013: No intracranial hemorrhage. Small vessel disease type changes without CT evidence of large acute infarct. Mild atrophy without hydrocephalus. It was felt that she most likely had a vasovagal syncope. It was suggested that she stay overnight for further observation and workup she declined. She was not found to be orthostatic.   She had noted a right arm tremor and change in Shawna Doyle handwriting. She denied recurrent headaches changes with Shawna Doyle speech or swallowing, changes in Shawna Doyle gait or any other focal neurologic problems. She has had some loss of appetite and has lost some weight. There is no family history of Parkinson's disease or tremors and there is no personal history of neuroleptic medication use, no pesticide or toxin exposures.   Shawna Doyle family had noted the tremor. She fell in 2014 while walking the dog, and fell on the road, no Fx, no head injury, no LOC. She has not noted any memory loss, mood d/o, hallucinations, or recurrent HAs. She has been driving, not at night and tries to avoid the interstate. She lives in an apartment and does Shawna Doyle own groceries, cleaning, cooking. She lives alone and no longer keeps Shawna Doyle GD's dog.    Shawna Doyle Past Medical History Is Significant For: Past Medical History  Diagnosis Date  . Hypertension   . Hyperlipidemia   . Chest pain   . GERD (gastroesophageal reflux disease)   . Tremor     Tremor in right arm, Pt not sure why  . Parkinsonism ([place]) 06/01/2013    Shawna Doyle Past Surgical History Is Significant For: Past Surgical History  Procedure Laterality Date  . Appendectomy    . Tonsillectomy    . Cardiovascular stress test  05/29/2005    EF 80%    Shawna Doyle  Family History Is Significant For: Family History  Problem Relation Age of Onset  . Heart attack Mother   . Heart attack Father   . Heart attack Brother     Shawna Doyle Social History Is Significant For: Social History   Social History  . Marital Status: Widowed    Spouse Name: N/A  . Number of Children: N/A  . Years of Education: N/A   Social History Main Topics  . Smoking status: Never Smoker   . Smokeless tobacco: None  . Alcohol Use: No  . Drug Use: No  . Sexual Activity: Not Asked   Other Topics Concern  . None   Social History Narrative    Shawna Doyle Allergies Are:  Allergies  Allergen Reactions  . Flagyl [Metronidazole Hcl]   . Sulfur   :   Shawna Doyle Current Medications Are:  Outpatient Encounter Prescriptions as of 05/09/2015  Medication Sig  . alendronate (FOSAMAX) 70 MG tablet Take 70 mg by mouth every 7 (seven) days. Take with a full glass of water on an empty stomach.   Marland Kitchen aspirin 81 MG tablet Take 81 mg by mouth daily.  Marland Kitchen atorvastatin (LIPITOR) 20 MG tablet TAKE ONE TABLET BY MOUTH ONCE DAILY  . Calcium Carb-Cholecalciferol (CALCIUM 600+D3 PO) Take 600 mg by mouth 2 (two) times daily before a meal.   . carbidopa-levodopa (SINEMET IR) 25-100 MG per tablet Take 0.5 tablets by mouth 3 (three) times daily.  . Cholecalciferol (VITAMIN D PO) Take 1,000 mg by mouth daily.    . folic acid (FOLVITE) 1 MG tablet Take 1 mg by mouth daily.  Marland Kitchen latanoprost (XALATAN) 0.005 % ophthalmic solution   . losartan-hydrochlorothiazide (HYZAAR) 100-12.5 MG per tablet Take 1 tablet by mouth daily.    . methotrexate (RHEUMATREX) 2.5 MG tablet Take 7.5 mg by mouth once a week. Every Thursday Caution:Chemotherapy. Protect from light.  . Multiple Vitamin (MULTIVITAMIN) tablet Take 1 tablet by mouth daily.    Marland Kitchen PREVIDENT 5000 BOOSTER PLUS 1.1 % PSTE Place 1 application onto teeth daily.  . [DISCONTINUED] TRAVATAN Z 0.004 % SOLN ophthalmic solution Place 1 drop into both eyes at bedtime.   No  facility-administered encounter medications on file as of 05/09/2015.  :  Review of Systems:  Out of a complete 14 point review of systems, all are reviewed and negative with the exception of these symptoms as listed below:   Review of Systems  Neurological:       No new concerns per patient.     Objective:  Neurologic Exam  Physical Exam Physical Examination:   Filed Vitals:   05/09/15 1029  BP: 136/70  Pulse: 78  Resp: 16   General Examination: The patient is a very pleasant 81 y.o. female in no acute distress. She is able to provide history very well. She is in good spirits. She is well-groomed. She is situated comfortably in the chair.   HEENT: Normocephalic, atraumatic, pupils are equal, round and reactive to light and accommodation. Extraocular tracking shows mild saccadic breakdown without nystagmus noted. She had bilateral cataract repairs. There is limitation to upper gaze. There is no decrease in eye blink rate. Hearing is impaired mildly; she has bilateral hearing aids.  Face is symmetric with minimal facial masking and normal facial sensation. There is no lip, neck or jaw tremor. Neck is moderately rigid with intact passive ROM. There are no carotid bruits on auscultation. Oropharynx exam reveals mild mouth dryness. No significant airway crowding is noted. Mallampati is class II. Tongue protrudes centrally and palate elevates symmetrically. There is no actual drooling, but a little bit of excess moisture around the corners of Shawna Doyle mouth.   Chest: is clear to auscultation without wheezing, rhonchi or crackles noted.  Heart: sounds are regular and normal without murmurs, rubs or gallops noted.   Abdomen: is soft, non-tender and non-distended with normal bowel sounds appreciated on auscultation.  Extremities: There is no pitting edema in the left leg but she does have mild edema of the right foot and ankle. She has no significant varicose veins on the left side but does have  some tightness in the right calf and slight prominence of veins in the right calf medially. She does report that she has noted a swelling of Shawna Doyle right ankle and foot since she came back from Shawna Doyle trip.  Skin: is warm and dry with no trophic changes noted.  Age-related changes are noted on the skin.   Musculoskeletal: exam reveals no obvious joint deformities other than arthritic changes to Shawna Doyle hands, no tenderness, joint swelling or erythema.  Neurologically:  Mental status: The patient is awake and alert, paying good attention. She is able to completely provide the history. She is oriented to: person, place, time/date, situation, day of week, month of year and year. Shawna Doyle memory, attention, language and knowledge are intact. There is no aphasia, agnosia, apraxia or anomia. There is a no bradyphrenia. Speech is mildly hypophonic with no dysarthria noted. Mood is congruent and affect is normal.    Cranial nerves are as described above under HEENT exam. In addition, shoulder shrug is normal with equal shoulder height noted.  Motor exam: Normal bulk, and strength for age is noted. There are no dyskinesias noted. Tone is minimally rigid on the RUE with presence of cogwheeling in the right upper extremity. There is overall minimal bradykinesia. There is no drift or rebound.  There is a mild resting tremor in the right upper extremity and no other resting tremor. The tremor is intermittent today. There is a minimal postural component, no action tremor. Romberg is negative.  Reflexes are 2+ in the upper extremities and 1+ in the lower extremities.     Fine motor skills exam: Finger taps are mild to moderately impaired on the right and mildly impaired on the left. Hand movements are mildly impaired on the right and not impaired on the left. RAP (rapid alternating patting) is mildly impaired on the right and minimally impaired on the left. Foot taps are mild to moderately impaired on the right and mildly impaired  on the left. Foot agility (in the form of heel stomping) is moderately impaired on the right and mildly impaired on the left.    Cerebellar testing shows no dysmetria or intention tremor on finger to nose testing. Heel to shin is unremarkable bilaterally. There is no truncal or gait ataxia.   Sensory exam is intact to light touch in the upper and lower extremities.   Gait, station and balance: She stands up from the seated position with very mild difficulty and does not need to push up with Shawna Doyle hands. She needs no assistance. No veering to one side is noted. She is not noted to lean to the side. Posture is mildly stooped, possibly age-appropriate. Stance is narrow-based. She walks with decrease in stride length and pace and decreased arm swing on the right. She turns in 3 steps. Tandem walk is not possible. Balance is mildly impaired. She does not report any lightheadedness upon standing or pain in the right leg.   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 Unfortunately, she developed significant constipation when we increased Shawna Doyle medicine to 1 pill 3 times a day in the past. Findings are largely stable on exam. However, I do worry about this new mild swelling in Shawna Doyle right foot and ankle. She does have a slight degree of calf tightness but no tenderness on palpation and perhaps prominence of veins on the right side in the calf. Thankfully, she has an appointment coming up with Shawna Doyle primary care physician this afternoon. In light of Shawna Doyle  recent long car trip and long plane ride back I worry about a DVT. She is urgently encouraged to discuss  this with Shawna Doyle primary care physician, Dr. Laurann Montana this afternoon. If she suddenly were to develop chest pain or shortness breath she is reminded to call 911. We will keep Shawna Doyle Sinemet at half a pill 3 times a day. She is encouraged to stay active mentally and physically and drink enough water. I would like to see Shawna Doyle back in 4 months, sooner if needed. If she continues to be stable we can certainly stretch Shawna Doyle appointments again to 6 month intervals. I answered all Shawna Doyle questions today and the patient was in agreement. I spent 25 minutes in total face-to-face time with the patient, more than 50% of which was spent in counseling and coordination of care, reviewing test results, reviewing medication and discussing or reviewing the diagnosis of PD, DVT, the prognosis and treatment options.

## 2015-05-09 NOTE — Patient Instructions (Addendum)
Please have Dr. Valentina Lucks look at your right leg foot today. In light of a recent long car trip to [place] and then a long plane ride back home, I worry about the one sided swelling, which can indicate a blood clot. Please discuss urgently with her, it's good you have an appointment this afternoon!   If you suddenly develop chest pain or shortness of breath, call 911!  We will keep your Parkinson's medication the same.

## 2015-05-11 ENCOUNTER — Other Ambulatory Visit: Payer: Self-pay | Admitting: Cardiovascular Disease

## 2015-06-13 DIAGNOSIS — R251 Tremor, unspecified: Secondary | ICD-10-CM | POA: Diagnosis not present

## 2015-06-13 DIAGNOSIS — M810 Age-related osteoporosis without current pathological fracture: Secondary | ICD-10-CM | POA: Diagnosis not present

## 2015-06-13 DIAGNOSIS — M0579 Rheumatoid arthritis with rheumatoid factor of multiple sites without organ or systems involvement: Secondary | ICD-10-CM | POA: Diagnosis not present

## 2015-06-29 DIAGNOSIS — H401192 Primary open-angle glaucoma, unspecified eye, moderate stage: Secondary | ICD-10-CM | POA: Diagnosis not present

## 2015-09-05 ENCOUNTER — Other Ambulatory Visit: Payer: Self-pay | Admitting: Cardiovascular Disease

## 2015-09-13 DIAGNOSIS — M0579 Rheumatoid arthritis with rheumatoid factor of multiple sites without organ or systems involvement: Secondary | ICD-10-CM | POA: Diagnosis not present

## 2015-09-14 ENCOUNTER — Ambulatory Visit (INDEPENDENT_AMBULATORY_CARE_PROVIDER_SITE_OTHER): Payer: Medicare Other | Admitting: Neurology

## 2015-09-14 ENCOUNTER — Encounter: Payer: Self-pay | Admitting: Neurology

## 2015-09-14 VITALS — BP 122/60 | HR 78 | Resp 16 | Ht 62.0 in | Wt 145.0 lb

## 2015-09-14 DIAGNOSIS — H9193 Unspecified hearing loss, bilateral: Secondary | ICD-10-CM | POA: Diagnosis not present

## 2015-09-14 DIAGNOSIS — G20 Parkinson's disease: Secondary | ICD-10-CM | POA: Diagnosis not present

## 2015-09-14 NOTE — Progress Notes (Signed)
Subjective:    Patient ID: Shawna Doyle is a 82 y.o. female.  HPI     Interim history:   Shawna Doyle is a very pleasant 82 year old right-handed woman with an underlying medical history of hyperlipidemia, hypertension, colonic polyps, heart disease, diverticulitis, osteopenia and vitamin D deficiency who presents for followup consultation of her parkinsonism. She is unaccompanied today. I last saw her on 05/09/2015, at which time she reported doing well. She and her daughter had recently visited her granddaughter in [place]. They drove to [place] and flew back. She had some constipation. She had lower extremity swelling on the right and she was advised to get it checked out by her PCP, especially in light of her long car ride.  Today, 09/14/2015: She reports feeling stable. She exercises regularly at the gym. Memory and mood are stable. She tolerates low-dose Sinemet. Her right leg swelling has resolved. She saw Dr. Laurann Doyle for her swelling as well. She will see her again soon and also her rheumatologist. She is trying to see all her doctors because she is planning to move away back to [place] where she is originally from and where she still has family. Her daughter and her family are also moving to [place]. She will be renting an apartment in a retirement community. They have a gym on-site. She knows that she will have to establish care with a primary doctor and also specialist there. She has enough refills on her Sinemet. She has not had any recent falls. She tries to stay hydrated but does not always do a good enough job she feels. When she exercises at the gym she always takes water with her. She prefers to use a stationary bike.  Previously:   I saw her on 10/27/2014, at which time she reported a fall a month prior while in [place] with her family. Thankfully she did not hurt herself. She was going down some stairs at a restaurant with her stairs were poorly lit. She did not see the last step.  She was still trying to stay active and exercised 5 days a week at the gym. She was trying to eat regularly and drink enough water. 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 She was enjoying working with little children. I suggested she continue with low-dose Sinemet, half a pill 3 times a day.   I saw her on 04/27/2014, at which time she reported doing well with carbidopa-levodopa half a pill 3 times a day and clinically she appeared better. I did not make any medication changes. She was encouraged to exercise and do better with her nutrition and hydration. She had no significant issues with constipation especially since she lowered her Sinemet to half a pill 3 times a day from a full pill 3 times a day previously. She was still driving.  I saw her on 11/30/2013, at which time she reported difficulty with her right hand with increase in tremor and worsening handwriting. She felt stable with regards to her mood and her memory. She had not been driving at night. 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 I felt she had progressed mildly. I suggested a trial of Sinemet low dose. I asked her to start with 25-100 mg strength half a pill twice daily with slow titration to up to one pill 3 times a day. She called in June and reported issues with constipation and was advised to reduce Sinemet to half a  pill 3 times a day.  I saw her on 06/01/2013, at which time I felt her exam was consistent with mild parkinsonism, possibly right ear Parkinson's disease. I felt she was stable. She reported a fainting spell and I felt she had a vasovagal syncope. She reported a tendency not to drink enough fluid and often skipping her lunch. I advised her strongly to keep well hydrated and not to skip any meals. I asked her to exercise regularly. I did not suggest any new medications especially in light of her recent syncopal spell and possible side effects from  parkinsonian medication. I asked her to drive only locally and then 35 miles zones, during the daytime and avoid interstates.   I first met her on 02/11/2013, and which time I suggested watchful waiting as far as her parkinsonian symptoms and started no new medications. I did suggest a brain MRI. She had a brain MRI without contrast on 03/13/2013 and I reviewed the results: Abnormal MRI scan of the brain showing mild changes of chronic microvascular ischemia and generalized cerebral atrophy. We called her with the results.    In the interim, on 05/02/2013 she presented to the emergency room after a brief episode of syncope while at church. She felt lightheaded and had a brief passing out spell but someone caught her and was told she was out for about 5 seconds. She did not injure herself. She indicated having had a good breakfast, but not drinking enough fluid. She did have a plain head CT on 05/02/2013: No intracranial hemorrhage. Small vessel disease type changes without CT evidence of large acute infarct. Mild atrophy without hydrocephalus. It was felt that she most likely had a vasovagal syncope. It was suggested that she stay overnight for further observation and workup she declined. She was not found to be orthostatic.   She had noted a right arm tremor and change in her handwriting. She denied recurrent headaches changes with her speech or swallowing, changes in her gait or any other focal neurologic problems. She has had some loss of appetite and has lost some weight. There is no family history of Parkinson's disease or tremors and there is no personal history of neuroleptic medication use, no pesticide or toxin exposures.   Her family had noted the tremor. She fell in 2014 while walking the dog, and fell on the road, no Fx, no head injury, no LOC. She has not noted any memory loss, mood d/o, hallucinations, or recurrent HAs. She has been driving, not at night and tries to avoid the interstate. She  lives in an apartment and does her own groceries, cleaning, cooking. She lives alone and no longer keeps her GD's dog.    Her Past Medical History Is Significant For: Past Medical History  Diagnosis Date  . Hypertension   . Hyperlipidemia   . Chest pain   . GERD (gastroesophageal reflux disease)   . Tremor     Tremor in right arm, Pt not sure why  . Parkinsonism ([facility]) 06/01/2013    Her Past Surgical History Is Significant For: Past Surgical History  Procedure Laterality Date  . Appendectomy    . Tonsillectomy    . Cardiovascular stress test  05/29/2005    EF 80%    Her Family History Is Significant For: Family History  Problem Relation Age of Onset  . Heart attack Mother   . Heart attack Father   . Heart attack Brother     Her Social History Is Significant  For: Social History   Social History  . Marital Status: Widowed    Spouse Name: N/A  . Number of Children: N/A  . Years of Education: N/A   Social History Main Topics  . Smoking status: Never Smoker   . Smokeless tobacco: None  . Alcohol Use: No  . Drug Use: No  . Sexual Activity: Not Asked   Other Topics Concern  . None   Social History Narrative    Her Allergies Are:  Allergies  Allergen Reactions  . Flagyl [Metronidazole Hcl]   . Sulfur   :   Her Current Medications Are:  Outpatient Encounter Prescriptions as of 09/14/2015  Medication Sig  . alendronate (FOSAMAX) 70 MG tablet Take 70 mg by mouth every 7 (seven) days. Take with a full glass of water on an empty stomach.   Marland Kitchen aspirin 81 MG tablet Take 81 mg by mouth daily.  Marland Kitchen atorvastatin (LIPITOR) 20 MG tablet TAKE ONE TABLET BY MOUTH ONCE DAILY  . Calcium Carb-Cholecalciferol (CALCIUM 600+D3 PO) Take 600 mg by mouth 2 (two) times daily before a meal.   . carbidopa-levodopa (SINEMET IR) 25-100 MG tablet Take 0.5 tablets by mouth 3 (three) times daily.  . Cholecalciferol (VITAMIN D PO) Take 1,000 mg by mouth daily.    . folic acid (FOLVITE) 1 MG  tablet Take 1 mg by mouth daily.  Marland Kitchen latanoprost (XALATAN) 0.005 % ophthalmic solution   . losartan-hydrochlorothiazide (HYZAAR) 100-12.5 MG per tablet Take 1 tablet by mouth daily.    . methotrexate (RHEUMATREX) 2.5 MG tablet Take 7.5 mg by mouth once a week. Every Thursday Caution:Chemotherapy. Protect from light.  . Multiple Vitamin (MULTIVITAMIN) tablet Take 1 tablet by mouth daily.    Marland Kitchen PREVIDENT 5000 BOOSTER PLUS 1.1 % PSTE Place 1 application onto teeth daily.   No facility-administered encounter medications on file as of 09/14/2015.  :  Review of Systems:  Out of a complete 14 point review of systems, all are reviewed and negative with the exception of these symptoms as listed below:   Review of Systems  Neurological:       Patient reports that she is doing the same as last visit. No new concerns.     Objective:  Neurologic Exam  Physical Exam Physical Examination:   Filed Vitals:   09/14/15 1026  BP: 122/60  Pulse: 78  Resp: 16   General Examination: The patient is a very pleasant 82 y.o. female in no acute distress. She is able to provide history very well. She is in good spirits today, well-groomed.    HEENT: Normocephalic, atraumatic, pupils are equal, round and reactive to light and accommodation. Extraocular tracking shows mild saccadic breakdown without nystagmus noted. She had bilateral cataract repairs. There is limitation to upper gaze. There is no decrease in eye blink rate. Hearing is impaired mildly; she has bilateral hearing aids.  Face is symmetric with minimal facial masking and normal facial sensation. There is a very intermittent lower jaw tremor. Neck is moderately rigid with intact passive ROM. There are no carotid bruits on auscultation. Oropharynx exam reveals mild mouth dryness. No significant airway crowding is noted. Mallampati is class II. Tongue protrudes centrally and palate elevates symmetrically. There is no actual drooling, but a little bit of  excess moisture around the corners of her mouth.   Chest: is clear to auscultation without wheezing, rhonchi or crackles noted.  Heart: sounds are regular and normal without murmurs, rubs or gallops noted.   Abdomen:  is soft, non-tender and non-distended with normal bowel sounds appreciated on auscultation.  Extremities: There is no pitting edema in lower extremities today.  Skin: is warm and dry with no trophic changes noted. Age-related changes are noted on the skin.   Musculoskeletal: exam reveals no obvious joint deformities other than arthritic changes to her hands, no tenderness, joint swelling or erythema, mild bilateral hand ulnar deviation noted.  Neurologically:  Mental status: The patient is awake and alert, paying good attention. She is able to completely provide the history. She is oriented to: person, place, time/date, situation, day of week, month of year and year. Her memory, attention, language and knowledge are intact. There is no aphasia, agnosia, apraxia or anomia. There is a no bradyphrenia. Speech is mildly hypophonic with no dysarthria noted. Mood is congruent and affect is normal.    Cranial nerves are as described above under HEENT exam. In addition, shoulder shrug is normal with equal shoulder height noted.  Motor exam: Normal bulk, and strength for age is noted. There are no dyskinesias noted. Tone is minimally rigid on the RUE with presence of mild cogwheeling in the right upper extremity. There is overall mild bradykinesia. There is no drift or rebound.  There is a mild resting tremor in the right upper extremity and no other resting tremor. The tremor is constant today. There is a minimal postural component, no action tremor. Romberg is negative.  Reflexes are 2+ in the upper extremities and 1+ in the lower extremities.     Fine motor skills exam: Finger taps are mild to moderately impaired on the right and mildly impaired on the left. Hand movements are mildly  impaired on the right and not impaired on the left. RAP (rapid alternating patting) is mildly impaired on the right and minimally impaired on the left. Foot taps are mild to moderately impaired on the right and mildly impaired on the left. Foot agility (in the form of heel stomping) is moderately impaired on the right and mildly impaired on the left.    Cerebellar testing shows no dysmetria or intention tremor on finger to nose testing. Heel to shin is unremarkable bilaterally. There is no truncal or gait ataxia.   Sensory exam is intact to light touch in the upper and lower extremities.   Gait, station and balance: She stands up from the seated position with very mild difficulty and does not need to push up with Her hands. She needs no assistance. No veering to one side is noted. She is not noted to lean to the side. Posture is mildly stooped. Stance is narrow-based. She walks with decrease in stride length and pace and decreased arm swing on the right. She turns in 3 steps. Tandem walk is not possible. Balance is mildly impaired. She does not report any lightheadedness upon standing.   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 She has responded to low-dose levodopa, 25-100 milligrams strength half a pill 3 times a day. Unfortunately she had side effects when we increase this to 1 pill 3 times a day in the past, particularly she complained of more constipation. She has remained fairly stable, exam findings have been largely stable as well. She had some lower extremity edema last time but  this was in the context of a long car ride. Swelling has resolved. She has decided to move back to  [place] where she can be closer to family and her daughter is also moving back finger. I wish her all the best. She has enough refills to bridge her with respect to Sinemet. Once she has established care with a primary care physician there and hopefully also with a neurologist, we will be happy to send her records. I talked her about maintaining a healthy lifestyle in general, in particular with incorporating regular exercise to her daily regimen, keeping well hydrated and stay active mentally and physically. She is a very social and physically active lady. I am sure she will do well and hopefully she will have a smooth transition to her original hometown.  I answered all her questions today and the patient was in agreement. I spent 25 minutes in total face-to-face time with the patient, more than 50% of which was spent in counseling and coordination of care, reviewing test results, reviewing medication and discussing or reviewing the diagnosis of PD, the prognosis and treatment options.

## 2015-09-14 NOTE — Patient Instructions (Addendum)
I think your Parkinson's disease has remained fairly stable, which is reassuring. Nevertheless, as you know, this disease does progress with time. It can affect your balance, your memory, your mood, your bowel and bladder function, your posture, balance and walking and your activities of daily living. However, there are good supportive treatments and symptomatic treatments available, so most patients have a change to a good quality life and life expectancy is not typically altered. Overall you are doing fairly well but I do want to suggest a few things today:  Remember to drink plenty of fluid at least 6 glasses (8 oz each), eat healthy meals and do not skip any meals. Try to eat protein with a every meal and eat a healthy snack such as fruit or nuts in between meals. Try to keep a regular sleep-wake schedule and try to exercise daily, particularly in the form of walking, 20-30 minutes a day, if you can.   Taking your medication on schedule is key.   Try to stay active physically and mentally. Engage in social activities in your community and with your family and try to keep up with current events by reading the newspaper or watching the news. Try to do word puzzles and you may like to do puzzles and brain games on the computer such as on [email or web address].com/.   As far as your medications are concerned, I would like to suggest that you take your current medication with the following additional changes: no change in meds, continue with low dose sinemet 1/2 pill 3 times a day. Once you have found a new doctor and neurologist, we will be happy to fax your records.   I would like to see you back as needed as you are moving to [place]. I wish you good luck with your move and hope, you settle in smoothly.   Our phone number is [phone]. We also have an after hours call service for urgent matters and there is a physician on-call for urgent questions, that cannot wait till the next work day. For any emergencies you  know to call 911 or go to the nearest emergency room.

## 2015-09-18 DIAGNOSIS — M810 Age-related osteoporosis without current pathological fracture: Secondary | ICD-10-CM | POA: Diagnosis not present

## 2015-09-18 DIAGNOSIS — R251 Tremor, unspecified: Secondary | ICD-10-CM | POA: Diagnosis not present

## 2015-09-18 DIAGNOSIS — M0579 Rheumatoid arthritis with rheumatoid factor of multiple sites without organ or systems involvement: Secondary | ICD-10-CM | POA: Diagnosis not present

## 2015-10-12 DIAGNOSIS — I119 Hypertensive heart disease without heart failure: Secondary | ICD-10-CM | POA: Diagnosis not present

## 2015-10-12 DIAGNOSIS — I2510 Atherosclerotic heart disease of native coronary artery without angina pectoris: Secondary | ICD-10-CM | POA: Diagnosis not present

## 2015-10-12 DIAGNOSIS — E7800 Pure hypercholesterolemia, unspecified: Secondary | ICD-10-CM | POA: Diagnosis not present

## 2015-10-12 DIAGNOSIS — G20 Parkinson's disease: Secondary | ICD-10-CM | POA: Diagnosis not present

## 2015-10-12 DIAGNOSIS — E559 Vitamin D deficiency, unspecified: Secondary | ICD-10-CM | POA: Diagnosis not present

## 2015-10-12 DIAGNOSIS — M859 Disorder of bone density and structure, unspecified: Secondary | ICD-10-CM | POA: Diagnosis not present

## 2015-10-12 DIAGNOSIS — Z0000 Encounter for general adult medical examination without abnormal findings: Secondary | ICD-10-CM | POA: Diagnosis not present

## 2015-10-12 DIAGNOSIS — K219 Gastro-esophageal reflux disease without esophagitis: Secondary | ICD-10-CM | POA: Diagnosis not present

## 2015-10-19 ENCOUNTER — Ambulatory Visit (INDEPENDENT_AMBULATORY_CARE_PROVIDER_SITE_OTHER): Payer: Medicare Other | Admitting: Cardiovascular Disease

## 2015-10-19 ENCOUNTER — Encounter: Payer: Self-pay | Admitting: Cardiovascular Disease

## 2015-10-19 VITALS — BP 126/78 | HR 72 | Ht 62.0 in | Wt 141.0 lb

## 2015-10-19 DIAGNOSIS — E785 Hyperlipidemia, unspecified: Secondary | ICD-10-CM | POA: Diagnosis not present

## 2015-10-19 DIAGNOSIS — I10 Essential (primary) hypertension: Secondary | ICD-10-CM | POA: Diagnosis not present

## 2015-10-19 NOTE — Progress Notes (Signed)
Sharol Given Date of Birth  1929-04-14 [facility]     [place]  [address]    [address]   [address] [place], [place]  [address]    [place], [place]  [address] [phone]  Fax  [phone]  [phone]  Fax [phone]  Problems 1. Hypertension 2. Hyperlipidemia   History of Present Illness:  Shawna Doyle is an 82 yo with a hx of hypertension and hyperlipidemia.  She has done well since I last saw her.  She denies any chest pain or dyspnea.   She has been exercising regularly - she goes to the gym.  She has been having difficulty with her right knee and his been getting injections.  She deines any chest pain or dyspnea.  January 15, 2013:  Shawna Doyle is doing well.  She is able to do all of her normal activities without problems.  She did have some weakness after doing her laundry.  She felt better after eating and drinking and taking a rest.   No CP , no dyspnea.    Dec. 16, 2014:  She had a syncopal episode while at church in Oct.  We to the ER .  They thought it was volume depletion.  She knows that she does not drink enough.  No further episodes.  , no CP , no dyspnea.   January 18, 2014:  Shawna Doyle is doing well.  Still exercising regularly.   No further syncopal episodes.  She is eating and drinking better.   She has been diagnosed with Parkinsons - has a tremor in her right hand primarily.  03/28/2015: No CP , no dyspnea, doing well. Exercising some but not recently.   October 19, 2015: She is doing well .  Moving back to [place].   Her family is back there.  To the [place] area .  No CP or   Current Outpatient Prescriptions on File Prior to Visit  Medication Sig Dispense Refill  . alendronate (FOSAMAX) 70 MG tablet Take 70 mg by mouth every 7 (seven) days. Take with a full glass of water on an empty stomach.     Marland Kitchen aspirin 81 MG tablet Take 81 mg by mouth daily.    Marland Kitchen atorvastatin (LIPITOR) 20 MG tablet TAKE ONE TABLET BY MOUTH ONCE  DAILY 30 tablet 6  . Calcium Carb-Cholecalciferol (CALCIUM 600+D3 PO) Take 600 mg by mouth 2 (two) times daily before a meal.     . carbidopa-levodopa (SINEMET IR) 25-100 MG tablet Take 0.5 tablets by mouth 3 (three) times daily. 135 tablet 3  . Cholecalciferol (VITAMIN D PO) Take 1,000 mg by mouth daily.      . folic acid (FOLVITE) 1 MG tablet Take 1 mg by mouth daily.    Marland Kitchen latanoprost (XALATAN) 0.005 % ophthalmic solution Place 1 drop into both eyes at bedtime.     Marland Kitchen losartan-hydrochlorothiazide (HYZAAR) 100-12.5 MG per tablet Take 1 tablet by mouth daily.      . methotrexate (RHEUMATREX) 2.5 MG tablet Take 7.5 mg by mouth once a week. Every Thursday Caution:Chemotherapy. Protect from light.    . Multiple Vitamin (MULTIVITAMIN) tablet Take 1 tablet by mouth daily.      Marland Kitchen PREVIDENT 5000 BOOSTER PLUS 1.1 % PSTE Place 1 application onto teeth daily.     No current facility-administered medications on file prior to visit.    Allergies  Allergen Reactions  . Flagyl [Metronidazole Hcl]   . Sulfur  Past Medical History  Diagnosis Date  . Hypertension   . Hyperlipidemia   . Chest pain   . GERD (gastroesophageal reflux disease)   . Tremor     Tremor in right arm, Pt not sure why  . Parkinsonism ([facility]) 06/01/2013    Past Surgical History  Procedure Laterality Date  . Appendectomy    . Tonsillectomy    . Cardiovascular stress test  05/29/2005    EF 80%    History  Smoking status  . Never Smoker   Smokeless tobacco  . Not on file    History  Alcohol Use No    Family History  Problem Relation Age of Onset  . Heart attack Mother   . Heart attack Father   . Heart attack Brother     Reviw of Systems:  Reviewed in the HPI.  All other systems are negative.  Physical Exam: BP 126/78 mmHg  Pulse 72  Ht 5\' 2"  (1.575 m)  Wt 141 lb (63.957 kg)  BMI 25.78 kg/m2 The patient is alert and oriented x 3.  The mood and affect are normal.   Skin: warm and dry.  Color is normal.     HEENT:   Neck is supple, no JVD, normal carotids Lungs: clear  Heart: RR, S1 , S2   Abdomen: + BS, nontender., no HSM Extremities:  No c/c/e Neuro:  Nonfocal    ECG: NSR at 72.  1st degree AV block   Assessment / Plan:   1. Hypertension- her blood pressure is well-controlled. Continue current medications.  2. Hyperlipidemia- we'll check fasting labs today including basic medical profile, liver enzymes, and lipid profile.  She will be moving to [facility] . Have given her the names of several cardiologist to contact. [address] [place], [place])   Nahser, Sterling, MD  10/19/2015 10:24 AM    [facility] [address] [place],  [address] [place], [place]  [place] Pager [phone] Phone: [phone]; Fax: [phone]   [facility]  [address] [place], [place]  [place] [phone]   Fax [phone]

## 2015-10-19 NOTE — Patient Instructions (Signed)
Medication Instructions:  Your physician recommends that you continue on your current medications as directed. Please refer to the Current Medication list given to you today.   Labwork: None Ordered   Testing/Procedures: None Ordered   Follow-Up: Your physician recommends that you schedule a follow-up appointment in: as needed with Dr. Nahser   If you need a refill on your cardiac medications before your next appointment, please call your pharmacy.   Thank you for choosing [facility]! Jarrid Lienhard, RN [phone]    

## 2015-10-26 DIAGNOSIS — H1859 Other hereditary corneal dystrophies: Secondary | ICD-10-CM | POA: Diagnosis not present

## 2015-10-26 DIAGNOSIS — H401122 Primary open-angle glaucoma, left eye, moderate stage: Secondary | ICD-10-CM | POA: Diagnosis not present

## 2015-10-26 DIAGNOSIS — H5203 Hypermetropia, bilateral: Secondary | ICD-10-CM | POA: Diagnosis not present

## 2015-10-26 DIAGNOSIS — H401111 Primary open-angle glaucoma, right eye, mild stage: Secondary | ICD-10-CM | POA: Diagnosis not present

## 2015-10-26 DIAGNOSIS — H1851 Endothelial corneal dystrophy: Secondary | ICD-10-CM | POA: Diagnosis not present

## 2015-10-26 DIAGNOSIS — Z961 Presence of intraocular lens: Secondary | ICD-10-CM | POA: Diagnosis not present

## 2015-10-26 DIAGNOSIS — H353111 Nonexudative age-related macular degeneration, right eye, early dry stage: Secondary | ICD-10-CM | POA: Diagnosis not present

## 2015-10-26 DIAGNOSIS — H52223 Regular astigmatism, bilateral: Secondary | ICD-10-CM | POA: Diagnosis not present

## 2015-12-19 DIAGNOSIS — H26493 Other secondary cataract, bilateral: Secondary | ICD-10-CM | POA: Diagnosis not present

## 2016-03-19 DIAGNOSIS — R5383 Other fatigue: Secondary | ICD-10-CM | POA: Diagnosis not present

## 2016-03-19 DIAGNOSIS — I10 Essential (primary) hypertension: Secondary | ICD-10-CM | POA: Diagnosis not present

## 2016-03-19 DIAGNOSIS — G20 Parkinson's disease: Secondary | ICD-10-CM | POA: Diagnosis not present

## 2016-03-19 DIAGNOSIS — Z79899 Other long term (current) drug therapy: Secondary | ICD-10-CM | POA: Diagnosis not present

## 2016-03-19 DIAGNOSIS — E785 Hyperlipidemia, unspecified: Secondary | ICD-10-CM | POA: Diagnosis not present

## 2016-03-19 DIAGNOSIS — H401132 Primary open-angle glaucoma, bilateral, moderate stage: Secondary | ICD-10-CM | POA: Diagnosis not present

## 2016-04-17 DIAGNOSIS — R238 Other skin changes: Secondary | ICD-10-CM | POA: Diagnosis not present

## 2016-04-17 DIAGNOSIS — M79674 Pain in right toe(s): Secondary | ICD-10-CM | POA: Diagnosis not present

## 2016-04-17 DIAGNOSIS — B351 Tinea unguium: Secondary | ICD-10-CM | POA: Diagnosis not present

## 2016-04-17 DIAGNOSIS — M79675 Pain in left toe(s): Secondary | ICD-10-CM | POA: Diagnosis not present

## 2016-04-19 DIAGNOSIS — G20 Parkinson's disease: Secondary | ICD-10-CM | POA: Diagnosis not present

## 2016-04-23 DIAGNOSIS — Z23 Encounter for immunization: Secondary | ICD-10-CM | POA: Diagnosis not present

## 2016-04-26 DIAGNOSIS — M85852 Other specified disorders of bone density and structure, left thigh: Secondary | ICD-10-CM | POA: Diagnosis not present

## 2016-04-26 DIAGNOSIS — Z780 Asymptomatic menopausal state: Secondary | ICD-10-CM | POA: Diagnosis not present

## 2016-07-02 DIAGNOSIS — H401132 Primary open-angle glaucoma, bilateral, moderate stage: Secondary | ICD-10-CM | POA: Diagnosis not present

## 2016-08-26 DIAGNOSIS — M79674 Pain in right toe(s): Secondary | ICD-10-CM | POA: Diagnosis not present

## 2016-08-26 DIAGNOSIS — B351 Tinea unguium: Secondary | ICD-10-CM | POA: Diagnosis not present

## 2016-08-26 DIAGNOSIS — R238 Other skin changes: Secondary | ICD-10-CM | POA: Diagnosis not present

## 2016-08-26 DIAGNOSIS — M79675 Pain in left toe(s): Secondary | ICD-10-CM | POA: Diagnosis not present

## 2016-10-01 DIAGNOSIS — R946 Abnormal results of thyroid function studies: Secondary | ICD-10-CM | POA: Diagnosis not present

## 2016-10-08 DIAGNOSIS — H6120 Impacted cerumen, unspecified ear: Secondary | ICD-10-CM | POA: Diagnosis not present

## 2016-10-08 DIAGNOSIS — I10 Essential (primary) hypertension: Secondary | ICD-10-CM | POA: Diagnosis not present

## 2016-10-08 DIAGNOSIS — R946 Abnormal results of thyroid function studies: Secondary | ICD-10-CM | POA: Diagnosis not present

## 2016-10-08 DIAGNOSIS — R42 Dizziness and giddiness: Secondary | ICD-10-CM | POA: Diagnosis not present

## 2016-10-08 DIAGNOSIS — E785 Hyperlipidemia, unspecified: Secondary | ICD-10-CM | POA: Diagnosis not present

## 2016-10-16 DIAGNOSIS — G20 Parkinson's disease: Secondary | ICD-10-CM | POA: Diagnosis not present

## 2016-10-30 DIAGNOSIS — E785 Hyperlipidemia, unspecified: Secondary | ICD-10-CM | POA: Diagnosis not present

## 2016-10-30 DIAGNOSIS — I10 Essential (primary) hypertension: Secondary | ICD-10-CM | POA: Diagnosis not present

## 2016-10-30 DIAGNOSIS — R42 Dizziness and giddiness: Secondary | ICD-10-CM | POA: Diagnosis not present

## 2016-11-06 DIAGNOSIS — H6123 Impacted cerumen, bilateral: Secondary | ICD-10-CM | POA: Diagnosis not present

## 2016-11-06 DIAGNOSIS — H7291 Unspecified perforation of tympanic membrane, right ear: Secondary | ICD-10-CM | POA: Diagnosis not present

## 2016-12-23 DIAGNOSIS — M79675 Pain in left toe(s): Secondary | ICD-10-CM | POA: Diagnosis not present

## 2016-12-23 DIAGNOSIS — R238 Other skin changes: Secondary | ICD-10-CM | POA: Diagnosis not present

## 2016-12-23 DIAGNOSIS — M79674 Pain in right toe(s): Secondary | ICD-10-CM | POA: Diagnosis not present

## 2016-12-23 DIAGNOSIS — B351 Tinea unguium: Secondary | ICD-10-CM | POA: Diagnosis not present

## 2017-03-10 DIAGNOSIS — B351 Tinea unguium: Secondary | ICD-10-CM | POA: Diagnosis not present

## 2017-03-10 DIAGNOSIS — M79675 Pain in left toe(s): Secondary | ICD-10-CM | POA: Diagnosis not present

## 2017-03-10 DIAGNOSIS — M79674 Pain in right toe(s): Secondary | ICD-10-CM | POA: Diagnosis not present

## 2017-03-10 DIAGNOSIS — R238 Other skin changes: Secondary | ICD-10-CM | POA: Diagnosis not present

## 2017-04-14 DIAGNOSIS — G20 Parkinson's disease: Secondary | ICD-10-CM | POA: Diagnosis not present

## 2017-04-15 DIAGNOSIS — Z79899 Other long term (current) drug therapy: Secondary | ICD-10-CM | POA: Diagnosis not present

## 2017-04-15 DIAGNOSIS — Z23 Encounter for immunization: Secondary | ICD-10-CM | POA: Diagnosis not present

## 2017-04-15 DIAGNOSIS — R7989 Other specified abnormal findings of blood chemistry: Secondary | ICD-10-CM | POA: Diagnosis not present

## 2017-04-15 DIAGNOSIS — I10 Essential (primary) hypertension: Secondary | ICD-10-CM | POA: Diagnosis not present

## 2017-04-15 DIAGNOSIS — E785 Hyperlipidemia, unspecified: Secondary | ICD-10-CM | POA: Diagnosis not present

## 2017-05-07 DIAGNOSIS — H7291 Unspecified perforation of tympanic membrane, right ear: Secondary | ICD-10-CM | POA: Diagnosis not present

## 2017-05-07 DIAGNOSIS — H6123 Impacted cerumen, bilateral: Secondary | ICD-10-CM | POA: Diagnosis not present

## 2017-06-16 DIAGNOSIS — B351 Tinea unguium: Secondary | ICD-10-CM | POA: Diagnosis not present

## 2017-06-16 DIAGNOSIS — M79674 Pain in right toe(s): Secondary | ICD-10-CM | POA: Diagnosis not present

## 2017-06-16 DIAGNOSIS — M79675 Pain in left toe(s): Secondary | ICD-10-CM | POA: Diagnosis not present

## 2017-06-16 DIAGNOSIS — R238 Other skin changes: Secondary | ICD-10-CM | POA: Diagnosis not present

## 2017-06-18 DIAGNOSIS — Z23 Encounter for immunization: Secondary | ICD-10-CM | POA: Diagnosis not present

## 2017-07-11 DIAGNOSIS — G20 Parkinson's disease: Secondary | ICD-10-CM | POA: Diagnosis not present

## 2017-07-11 DIAGNOSIS — S0003XA Contusion of scalp, initial encounter: Secondary | ICD-10-CM | POA: Diagnosis not present

## 2017-07-11 DIAGNOSIS — R55 Syncope and collapse: Secondary | ICD-10-CM | POA: Diagnosis not present

## 2017-07-11 DIAGNOSIS — Z7982 Long term (current) use of aspirin: Secondary | ICD-10-CM | POA: Diagnosis not present

## 2017-07-16 DIAGNOSIS — R55 Syncope and collapse: Secondary | ICD-10-CM | POA: Diagnosis not present

## 2017-07-17 DIAGNOSIS — H26493 Other secondary cataract, bilateral: Secondary | ICD-10-CM | POA: Diagnosis not present

## 2023-01-20 DEATH — deceased
